# Patient Record
Sex: Female | Born: 1935 | Race: Black or African American | Hispanic: No | Smoking: Former smoker
Health system: Southern US, Community
[De-identification: ages and names within clinical notes are randomized; demographics above are authoritative.]

## PROBLEM LIST (undated history)

## (undated) DIAGNOSIS — S82843A Displaced bimalleolar fracture of unspecified lower leg, initial encounter for closed fracture: Secondary | ICD-10-CM

## (undated) DIAGNOSIS — I071 Rheumatic tricuspid insufficiency: Secondary | ICD-10-CM

## (undated) DIAGNOSIS — M25579 Pain in unspecified ankle and joints of unspecified foot: Secondary | ICD-10-CM

## (undated) DIAGNOSIS — E782 Mixed hyperlipidemia: Secondary | ICD-10-CM

## (undated) DIAGNOSIS — R0602 Shortness of breath: Secondary | ICD-10-CM

## (undated) DIAGNOSIS — K219 Gastro-esophageal reflux disease without esophagitis: Secondary | ICD-10-CM

## (undated) DIAGNOSIS — R600 Localized edema: Secondary | ICD-10-CM

## (undated) DIAGNOSIS — I2510 Atherosclerotic heart disease of native coronary artery without angina pectoris: Secondary | ICD-10-CM

## (undated) DIAGNOSIS — S82409A Unspecified fracture of shaft of unspecified fibula, initial encounter for closed fracture: Secondary | ICD-10-CM

## (undated) DIAGNOSIS — S91009A Unspecified open wound, unspecified ankle, initial encounter: Secondary | ICD-10-CM

## (undated) DIAGNOSIS — S82873A Displaced pilon fracture of unspecified tibia, initial encounter for closed fracture: Secondary | ICD-10-CM

## (undated) DIAGNOSIS — D649 Anemia, unspecified: Secondary | ICD-10-CM

## (undated) DIAGNOSIS — S72009A Fracture of unspecified part of neck of unspecified femur, initial encounter for closed fracture: Secondary | ICD-10-CM

## (undated) DIAGNOSIS — I5022 Chronic systolic (congestive) heart failure: Secondary | ICD-10-CM

## (undated) DIAGNOSIS — M79605 Pain in left leg: Secondary | ICD-10-CM

## (undated) DIAGNOSIS — N183 Chronic kidney disease, stage 3 (moderate): Secondary | ICD-10-CM

## (undated) DIAGNOSIS — S82209A Unspecified fracture of shaft of unspecified tibia, initial encounter for closed fracture: Secondary | ICD-10-CM

## (undated) DIAGNOSIS — R7989 Other specified abnormal findings of blood chemistry: Secondary | ICD-10-CM

## (undated) DIAGNOSIS — I340 Nonrheumatic mitral (valve) insufficiency: Secondary | ICD-10-CM

## (undated) DIAGNOSIS — S72143A Displaced intertrochanteric fracture of unspecified femur, initial encounter for closed fracture: Secondary | ICD-10-CM

## (undated) DIAGNOSIS — I10 Essential (primary) hypertension: Secondary | ICD-10-CM

## (undated) DIAGNOSIS — F329 Major depressive disorder, single episode, unspecified: Secondary | ICD-10-CM

## (undated) HISTORY — PX: CORONARY ARTERY BYPASS GRAFT: SHX141

## (undated) HISTORY — DX: Unspecified open wound, unspecified ankle, initial encounter: S91.009A

## (undated) HISTORY — DX: Anemia, unspecified: D64.9

## (undated) HISTORY — DX: Unspecified fracture of shaft of unspecified fibula, initial encounter for closed fracture: S82.409A

## (undated) HISTORY — DX: Mixed hyperlipidemia: E78.2

## (undated) HISTORY — DX: Chronic kidney disease, stage 3 (moderate): N18.3

## (undated) HISTORY — DX: Displaced pilon fracture of unspecified tibia, initial encounter for closed fracture: S82.873A

## (undated) HISTORY — DX: Chronic systolic (congestive) heart failure: I50.22

## (undated) HISTORY — DX: Pain in unspecified ankle and joints of unspecified foot: M25.579

## (undated) HISTORY — DX: Atherosclerotic heart disease of native coronary artery without angina pectoris: I25.10

## (undated) HISTORY — DX: Shortness of breath: R06.02

## (undated) HISTORY — DX: Major depressive disorder, single episode, unspecified: F32.9

## (undated) HISTORY — DX: Displaced intertrochanteric fracture of unspecified femur, initial encounter for closed fracture: S72.143A

## (undated) HISTORY — DX: Nonrheumatic mitral (valve) insufficiency: I34.0

## (undated) HISTORY — DX: Gastro-esophageal reflux disease without esophagitis: K21.9

## (undated) HISTORY — DX: Pain in left leg: M79.605

## (undated) HISTORY — DX: Unspecified fracture of shaft of unspecified tibia, initial encounter for closed fracture: S82.209A

## (undated) HISTORY — DX: Displaced bimalleolar fracture of unspecified lower leg, initial encounter for closed fracture: S82.843A

## (undated) HISTORY — DX: Localized edema: R60.0

## (undated) HISTORY — DX: Other specified abnormal findings of blood chemistry: R79.89

## (undated) HISTORY — PX: CARDIAC SURGERY: SHX584

## (undated) HISTORY — DX: Fracture of unspecified part of neck of unspecified femur, initial encounter for closed fracture: S72.009A

## (undated) HISTORY — DX: Rheumatic tricuspid insufficiency: I07.1

---

## 2003-05-10 ENCOUNTER — Other Ambulatory Visit: Payer: Self-pay

## 2003-05-11 ENCOUNTER — Other Ambulatory Visit: Payer: Self-pay

## 2003-10-11 ENCOUNTER — Other Ambulatory Visit: Payer: Self-pay

## 2004-02-14 ENCOUNTER — Emergency Department: Payer: Self-pay | Admitting: Internal Medicine

## 2004-02-14 ENCOUNTER — Other Ambulatory Visit: Payer: Self-pay

## 2004-03-14 ENCOUNTER — Other Ambulatory Visit: Payer: Self-pay

## 2004-03-14 ENCOUNTER — Inpatient Hospital Stay: Payer: Self-pay | Admitting: Internal Medicine

## 2004-03-17 ENCOUNTER — Ambulatory Visit: Payer: Self-pay | Admitting: Internal Medicine

## 2004-05-12 ENCOUNTER — Ambulatory Visit: Payer: Self-pay | Admitting: Unknown Physician Specialty

## 2004-07-11 ENCOUNTER — Emergency Department: Payer: Self-pay | Admitting: Emergency Medicine

## 2005-07-26 ENCOUNTER — Ambulatory Visit: Payer: Self-pay | Admitting: Internal Medicine

## 2005-10-15 ENCOUNTER — Other Ambulatory Visit: Payer: Self-pay

## 2005-10-15 ENCOUNTER — Emergency Department: Payer: Self-pay | Admitting: Emergency Medicine

## 2006-03-22 ENCOUNTER — Ambulatory Visit: Payer: Self-pay | Admitting: Unknown Physician Specialty

## 2006-08-11 ENCOUNTER — Ambulatory Visit: Payer: Self-pay | Admitting: Internal Medicine

## 2006-08-29 ENCOUNTER — Ambulatory Visit: Payer: Self-pay | Admitting: Unknown Physician Specialty

## 2006-09-27 ENCOUNTER — Ambulatory Visit: Payer: Self-pay | Admitting: Vascular Surgery

## 2006-10-11 ENCOUNTER — Ambulatory Visit: Payer: Self-pay | Admitting: Vascular Surgery

## 2007-05-08 ENCOUNTER — Emergency Department: Payer: Self-pay | Admitting: Emergency Medicine

## 2007-08-29 ENCOUNTER — Ambulatory Visit: Payer: Self-pay | Admitting: Internal Medicine

## 2007-10-02 ENCOUNTER — Ambulatory Visit: Payer: Self-pay | Admitting: Internal Medicine

## 2007-10-09 ENCOUNTER — Ambulatory Visit: Payer: Self-pay | Admitting: Internal Medicine

## 2007-11-01 ENCOUNTER — Ambulatory Visit: Payer: Self-pay | Admitting: Obstetrics and Gynecology

## 2008-01-10 ENCOUNTER — Ambulatory Visit: Payer: Self-pay | Admitting: Urology

## 2008-07-09 ENCOUNTER — Ambulatory Visit: Payer: Self-pay | Admitting: Urology

## 2008-11-12 ENCOUNTER — Ambulatory Visit: Payer: Self-pay | Admitting: Ophthalmology

## 2008-11-26 ENCOUNTER — Ambulatory Visit: Payer: Self-pay | Admitting: Ophthalmology

## 2009-01-29 ENCOUNTER — Ambulatory Visit: Payer: Self-pay | Admitting: Internal Medicine

## 2009-02-11 ENCOUNTER — Ambulatory Visit: Payer: Self-pay | Admitting: Ophthalmology

## 2009-07-14 ENCOUNTER — Ambulatory Visit: Payer: Self-pay | Admitting: Internal Medicine

## 2009-10-12 ENCOUNTER — Emergency Department: Payer: Self-pay | Admitting: Emergency Medicine

## 2009-12-09 ENCOUNTER — Ambulatory Visit: Payer: Self-pay | Admitting: Internal Medicine

## 2010-01-04 ENCOUNTER — Emergency Department: Payer: Self-pay | Admitting: Emergency Medicine

## 2010-08-12 ENCOUNTER — Inpatient Hospital Stay: Payer: Self-pay | Admitting: Internal Medicine

## 2010-09-19 ENCOUNTER — Emergency Department: Payer: Self-pay | Admitting: Unknown Physician Specialty

## 2011-03-15 ENCOUNTER — Ambulatory Visit: Payer: Self-pay

## 2012-12-01 ENCOUNTER — Emergency Department: Payer: Self-pay | Admitting: Emergency Medicine

## 2012-12-06 DIAGNOSIS — M25579 Pain in unspecified ankle and joints of unspecified foot: Secondary | ICD-10-CM

## 2012-12-06 DIAGNOSIS — S82843A Displaced bimalleolar fracture of unspecified lower leg, initial encounter for closed fracture: Secondary | ICD-10-CM

## 2012-12-06 HISTORY — DX: Pain in unspecified ankle and joints of unspecified foot: M25.579

## 2012-12-06 HISTORY — DX: Displaced bimalleolar fracture of unspecified lower leg, initial encounter for closed fracture: S82.843A

## 2012-12-11 ENCOUNTER — Ambulatory Visit: Payer: Self-pay | Admitting: Orthopaedic Surgery

## 2012-12-13 DIAGNOSIS — S82873A Displaced pilon fracture of unspecified tibia, initial encounter for closed fracture: Secondary | ICD-10-CM

## 2012-12-13 HISTORY — DX: Displaced pilon fracture of unspecified tibia, initial encounter for closed fracture: S82.873A

## 2012-12-19 DIAGNOSIS — I2510 Atherosclerotic heart disease of native coronary artery without angina pectoris: Secondary | ICD-10-CM

## 2012-12-19 DIAGNOSIS — K219 Gastro-esophageal reflux disease without esophagitis: Secondary | ICD-10-CM

## 2012-12-19 DIAGNOSIS — F329 Major depressive disorder, single episode, unspecified: Secondary | ICD-10-CM | POA: Insufficient documentation

## 2012-12-19 DIAGNOSIS — F32A Depression, unspecified: Secondary | ICD-10-CM

## 2012-12-19 HISTORY — DX: Atherosclerotic heart disease of native coronary artery without angina pectoris: I25.10

## 2012-12-19 HISTORY — DX: Depression, unspecified: F32.A

## 2012-12-19 HISTORY — DX: Gastro-esophageal reflux disease without esophagitis: K21.9

## 2012-12-22 DIAGNOSIS — S82409A Unspecified fracture of shaft of unspecified fibula, initial encounter for closed fracture: Secondary | ICD-10-CM

## 2012-12-22 DIAGNOSIS — S82209A Unspecified fracture of shaft of unspecified tibia, initial encounter for closed fracture: Secondary | ICD-10-CM | POA: Insufficient documentation

## 2012-12-22 HISTORY — DX: Unspecified fracture of shaft of unspecified tibia, initial encounter for closed fracture: S82.209A

## 2013-02-28 DIAGNOSIS — S91009A Unspecified open wound, unspecified ankle, initial encounter: Secondary | ICD-10-CM

## 2013-02-28 HISTORY — DX: Unspecified open wound, unspecified ankle, initial encounter: S91.009A

## 2013-08-04 ENCOUNTER — Emergency Department: Payer: Self-pay | Admitting: Emergency Medicine

## 2013-10-03 DIAGNOSIS — R0602 Shortness of breath: Secondary | ICD-10-CM | POA: Insufficient documentation

## 2013-10-03 HISTORY — DX: Shortness of breath: R06.02

## 2014-05-20 DIAGNOSIS — I5022 Chronic systolic (congestive) heart failure: Secondary | ICD-10-CM | POA: Insufficient documentation

## 2014-05-20 HISTORY — DX: Chronic systolic (congestive) heart failure: I50.22

## 2014-09-09 IMAGING — CR DG LUMBAR SPINE 2-3V
1 series · 3 of 3 positions shown · non-contrast
Comparison: none

REASON FOR EXAM: fall, low back pain
COMMENTS:

PROCEDURE:     DXR - DXR LUMBAR SPINE AP AND LATERAL  - December 01, 2012 [DATE]
RESULT:

[Series 2: t lumbar spine ap · 0.14mm/px · 3 of 3 slices shown]
[im 1/3]
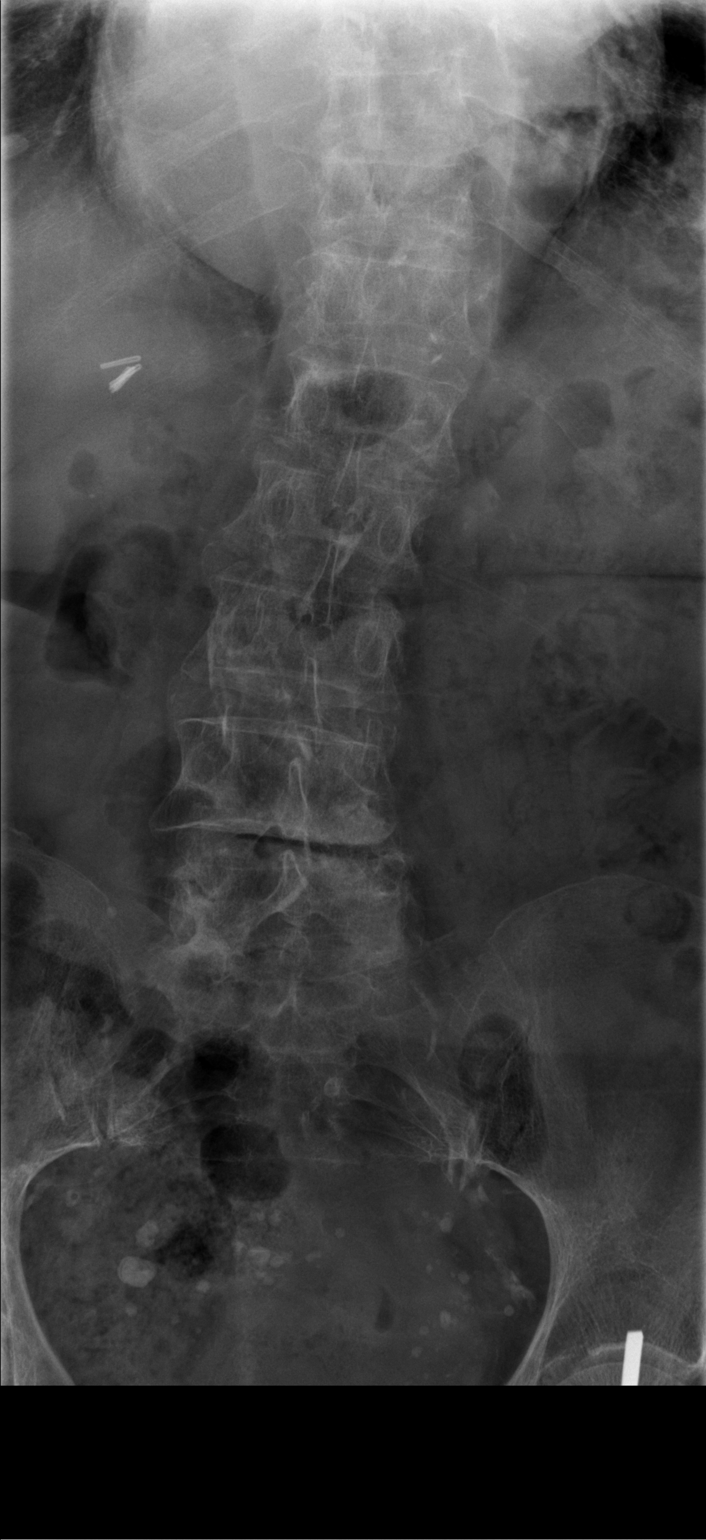
[im 2/3]
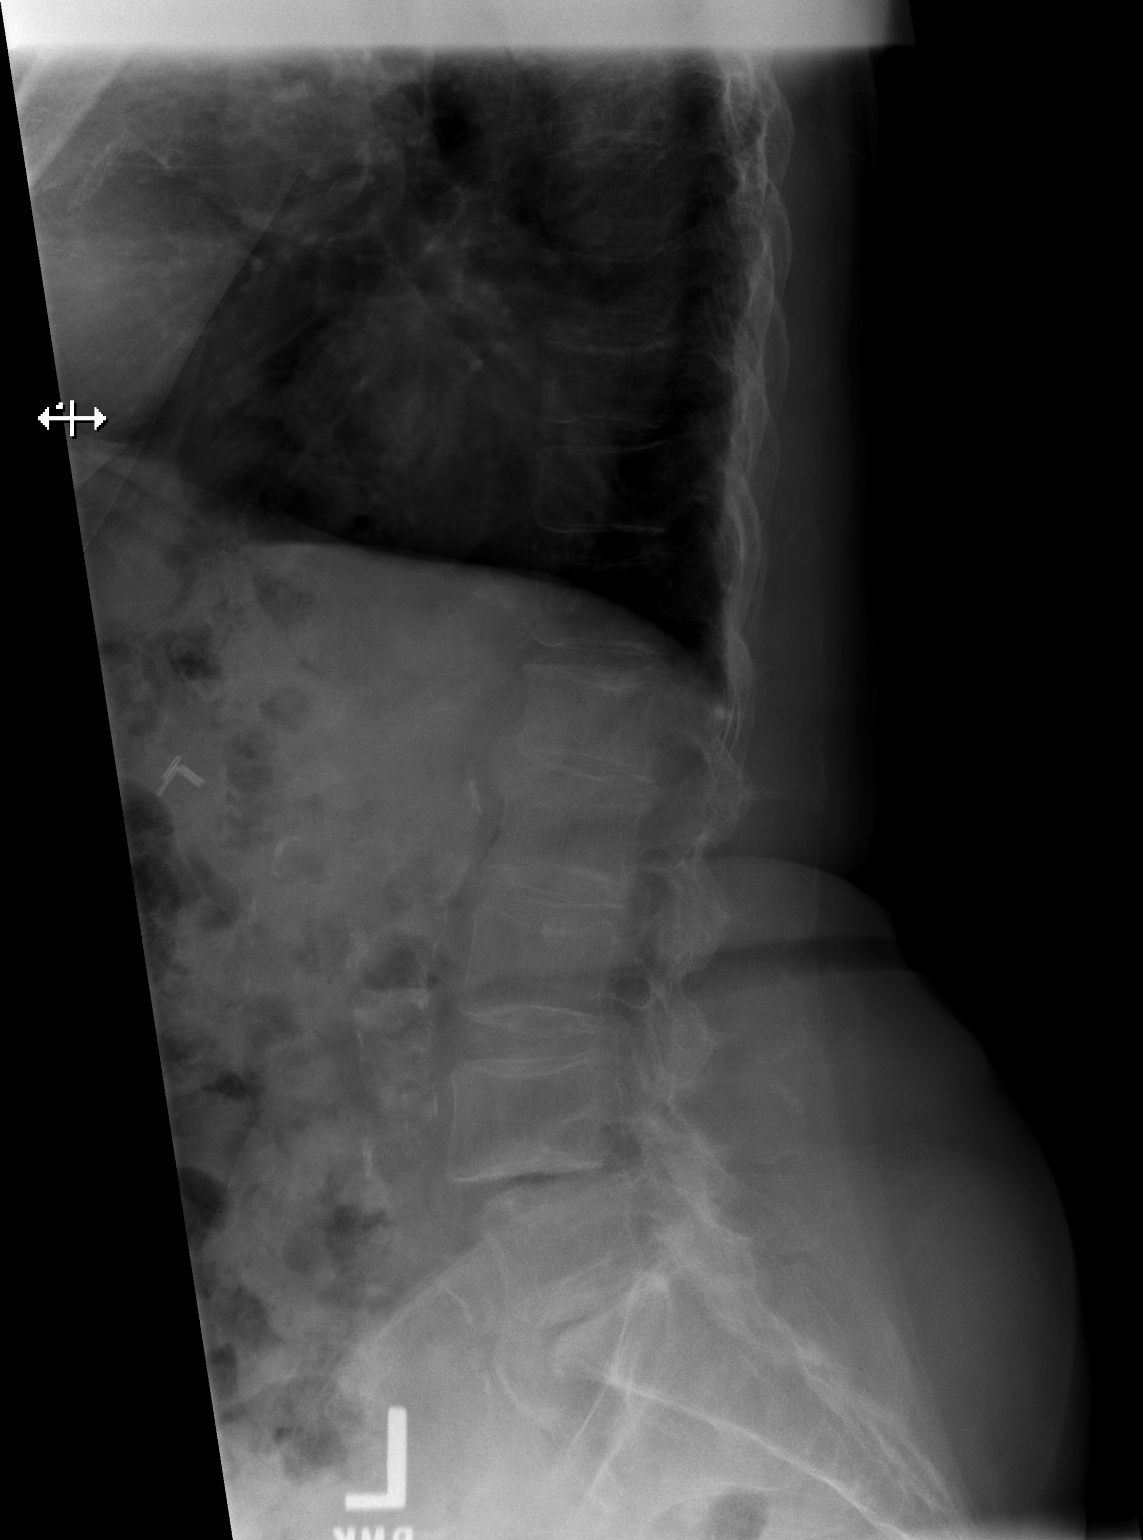
[im 3/3]
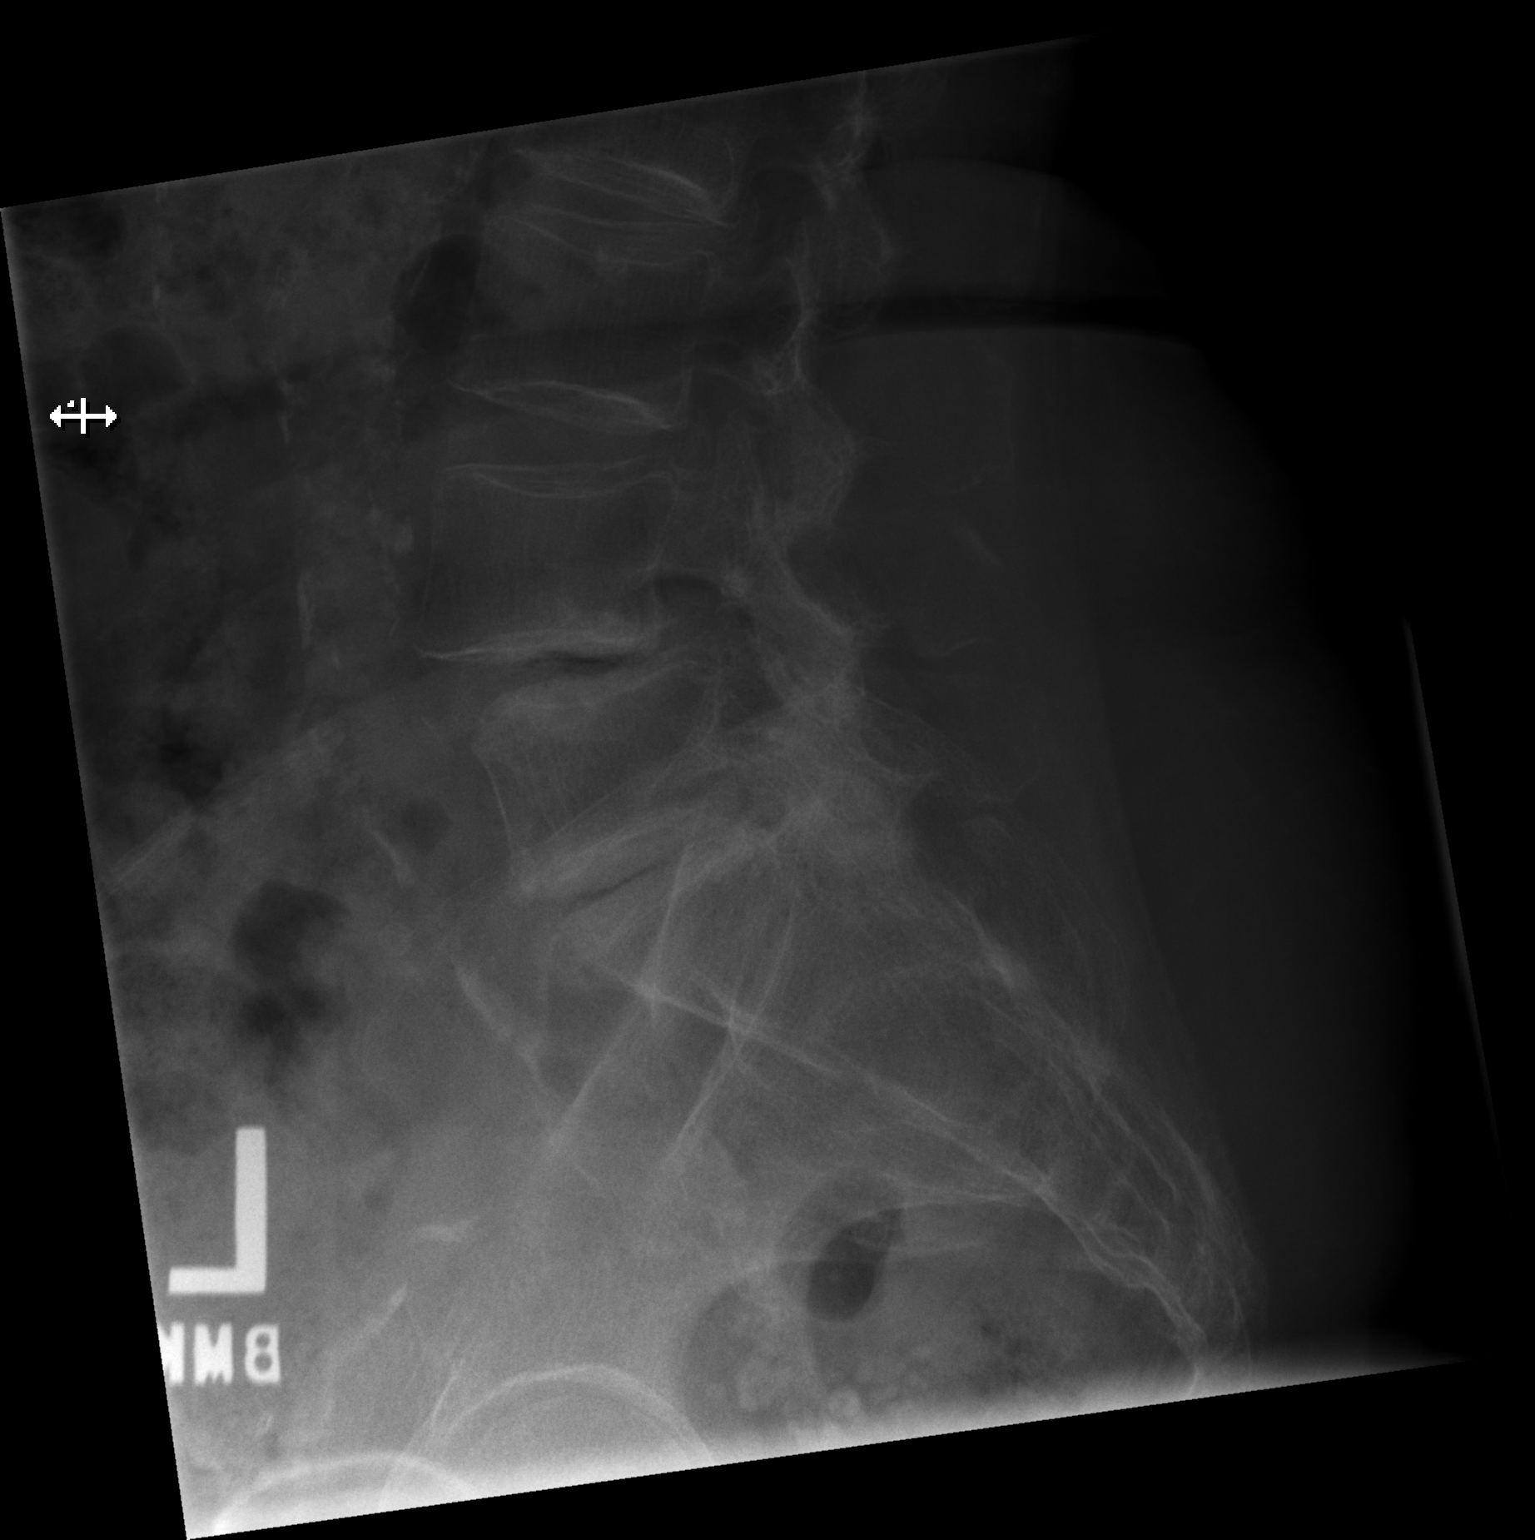

[3 of 3 positions shown; findings below may reference images not displayed]

FINDINGS: The bones are osteopenic. There is no evidence of acute fracture
or dislocation. Grade 1 anterolisthesis is appreciated at the L4-5 level.
Degenerative disc disease changes are appreciated at T12-L1, L1-2, L2-3,
L4-5 and L5-S1.
IMPRESSION: 1.     Osteopenia without evidence of acute osseous abnormalities.
2.     Multilevel degenerative disc disease changes.
3.     Grade I anterolisthesis, L4-5.

## 2014-09-09 IMAGING — CR DG HIP COMPLETE 2+V*L*
1 series · 2 of 2 positions shown · non-contrast
Comparison: none

REASON FOR EXAM: left hip pain, fall
COMMENTS:

PROCEDURE:     DXR - DXR HIP LEFT COMPLETE  - December 01, 2012 [DATE]
RESULT:     There is no evidence of fracture, dislocation, or malalignment.

[Series 2: t hip ap left · 0.14mm/px · 2 of 2 slices shown]
[im 1/2]
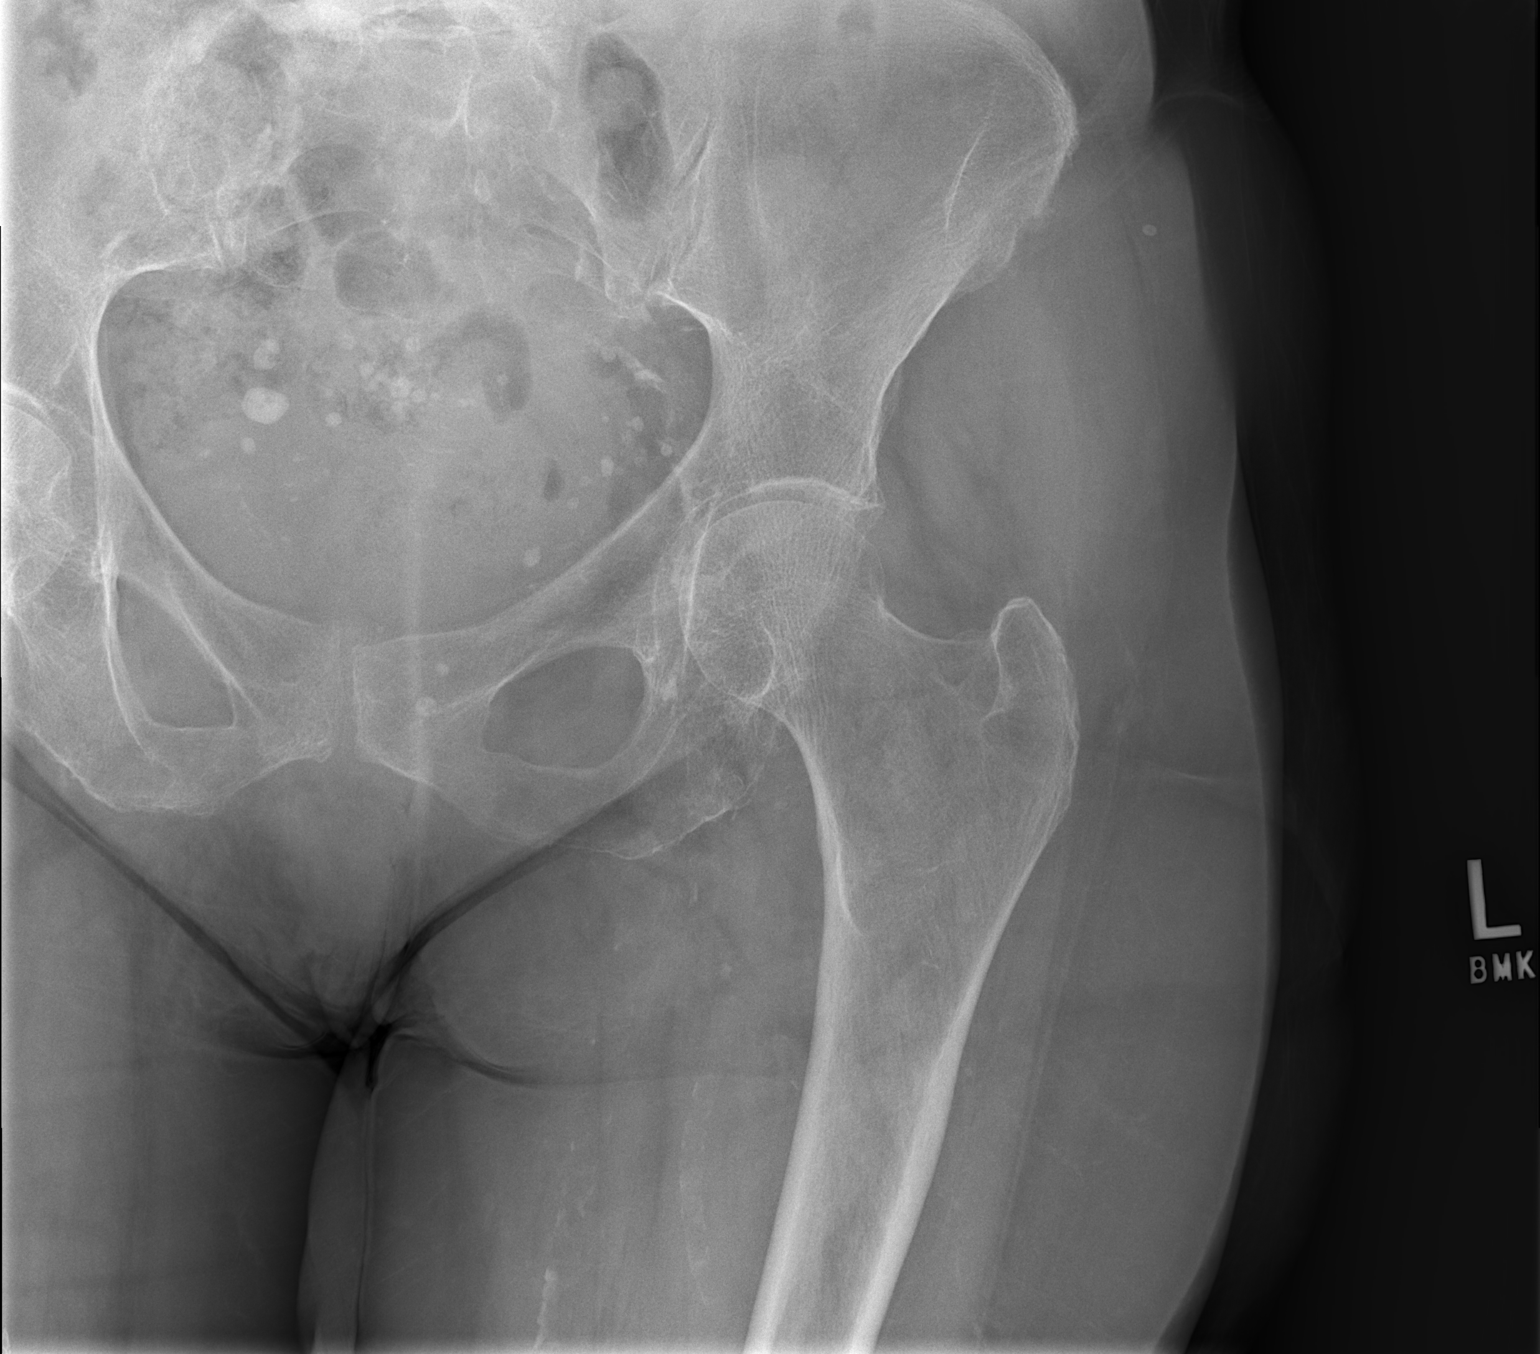
[im 2/2]
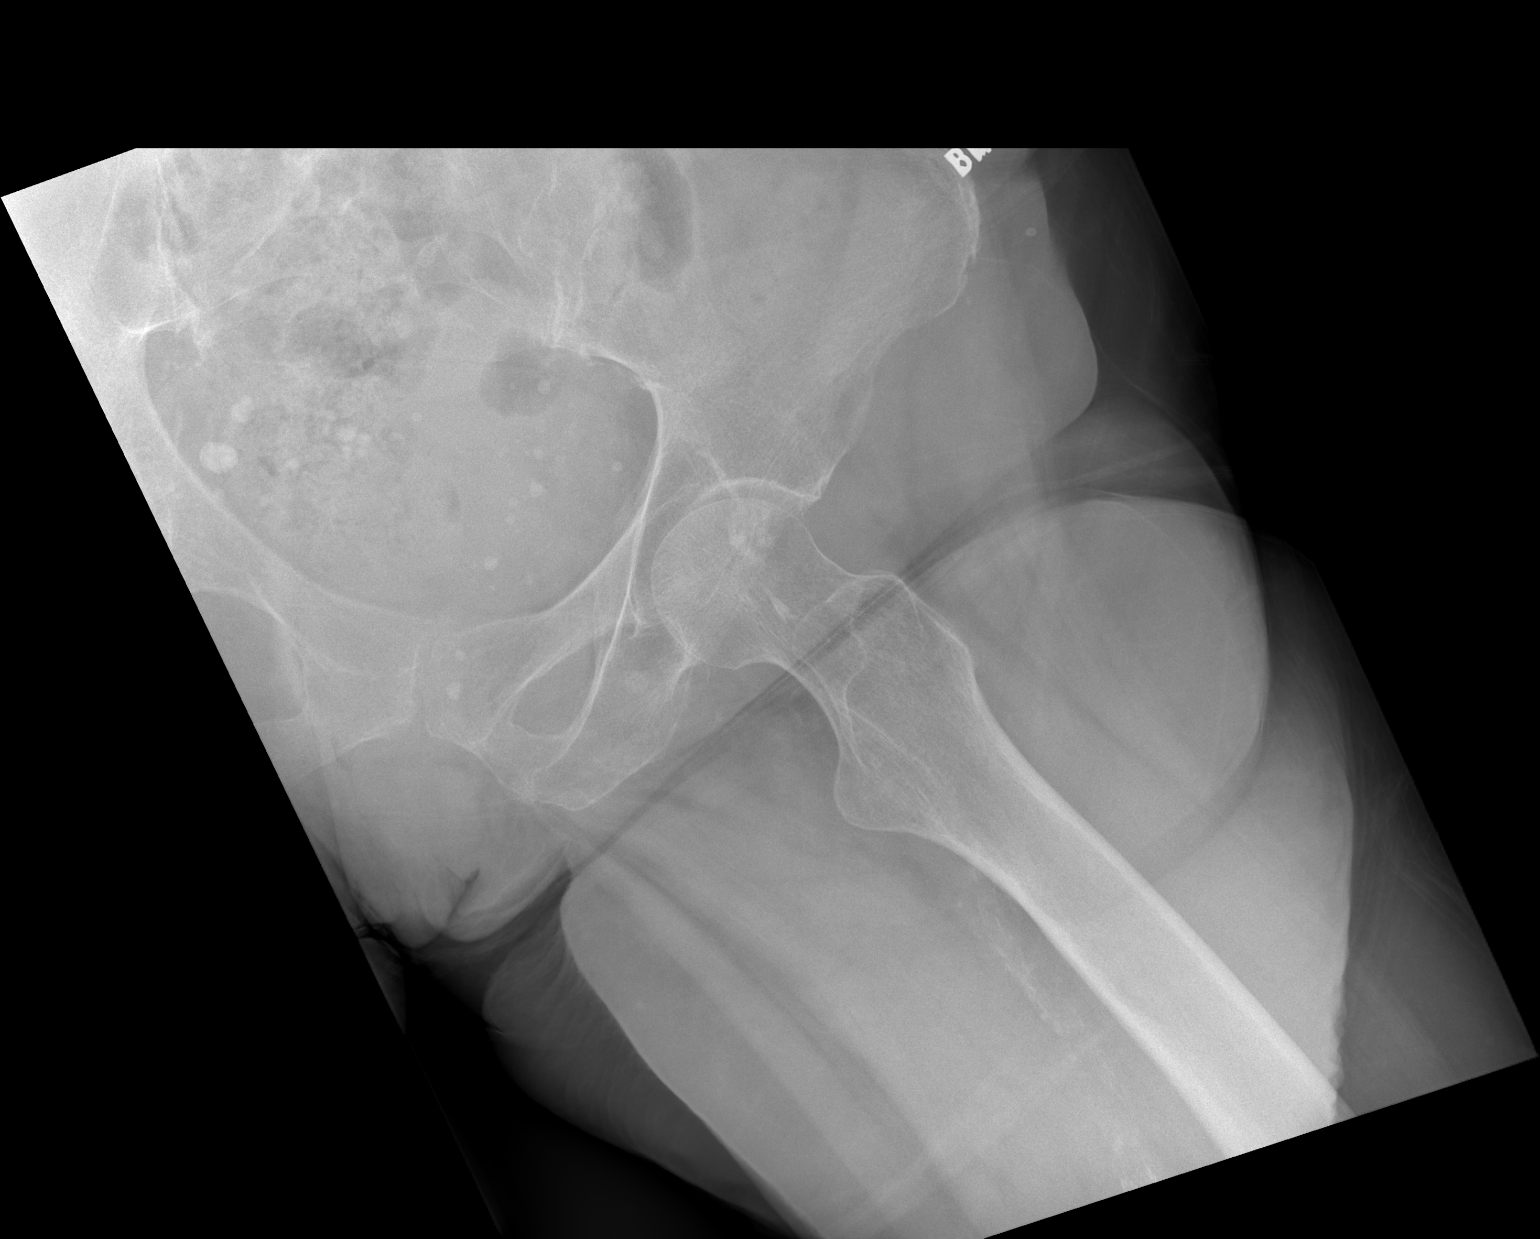

[2 of 2 positions shown; findings below may reference images not displayed]

IMPRESSION: 1. No evidence of acute abnormalities.
2. If there are persistent complaints of pain or persistent clinical
concern, a repeat evaluation in 7-10 days is recommended if clinically
warranted.

## 2014-09-13 DIAGNOSIS — I10 Essential (primary) hypertension: Secondary | ICD-10-CM | POA: Insufficient documentation

## 2015-01-20 DIAGNOSIS — N183 Chronic kidney disease, stage 3 unspecified: Secondary | ICD-10-CM

## 2015-01-20 DIAGNOSIS — M79605 Pain in left leg: Secondary | ICD-10-CM | POA: Insufficient documentation

## 2015-01-20 DIAGNOSIS — I340 Nonrheumatic mitral (valve) insufficiency: Secondary | ICD-10-CM | POA: Insufficient documentation

## 2015-01-20 DIAGNOSIS — I071 Rheumatic tricuspid insufficiency: Secondary | ICD-10-CM

## 2015-01-20 DIAGNOSIS — R600 Localized edema: Secondary | ICD-10-CM

## 2015-01-20 HISTORY — DX: Chronic kidney disease, stage 3 unspecified: N18.30

## 2015-01-20 HISTORY — DX: Rheumatic tricuspid insufficiency: I07.1

## 2015-01-20 HISTORY — DX: Localized edema: R60.0

## 2015-01-20 HISTORY — DX: Nonrheumatic mitral (valve) insufficiency: I34.0

## 2015-01-20 HISTORY — DX: Pain in left leg: M79.605

## 2015-03-30 ENCOUNTER — Inpatient Hospital Stay
Admission: EM | Admit: 2015-03-30 | Discharge: 2015-04-03 | DRG: 481 | Disposition: A | Discharge status: Skilled Nursing Facility | Payer: Medicare Other | Attending: Internal Medicine | Admitting: Internal Medicine

## 2015-03-30 ENCOUNTER — Emergency Department: Payer: Medicare Other

## 2015-03-30 ENCOUNTER — Encounter: Payer: Self-pay | Admitting: Emergency Medicine

## 2015-03-30 DIAGNOSIS — I255 Ischemic cardiomyopathy: Secondary | ICD-10-CM | POA: Diagnosis present

## 2015-03-30 DIAGNOSIS — I129 Hypertensive chronic kidney disease with stage 1 through stage 4 chronic kidney disease, or unspecified chronic kidney disease: Secondary | ICD-10-CM | POA: Diagnosis present

## 2015-03-30 DIAGNOSIS — Z79899 Other long term (current) drug therapy: Secondary | ICD-10-CM | POA: Diagnosis not present

## 2015-03-30 DIAGNOSIS — I2510 Atherosclerotic heart disease of native coronary artery without angina pectoris: Secondary | ICD-10-CM | POA: Diagnosis present

## 2015-03-30 DIAGNOSIS — Z7982 Long term (current) use of aspirin: Secondary | ICD-10-CM

## 2015-03-30 DIAGNOSIS — F0390 Unspecified dementia without behavioral disturbance: Secondary | ICD-10-CM | POA: Diagnosis present

## 2015-03-30 DIAGNOSIS — R339 Retention of urine, unspecified: Secondary | ICD-10-CM | POA: Diagnosis not present

## 2015-03-30 DIAGNOSIS — Z951 Presence of aortocoronary bypass graft: Secondary | ICD-10-CM

## 2015-03-30 DIAGNOSIS — N179 Acute kidney failure, unspecified: Secondary | ICD-10-CM | POA: Diagnosis not present

## 2015-03-30 DIAGNOSIS — E785 Hyperlipidemia, unspecified: Secondary | ICD-10-CM | POA: Diagnosis present

## 2015-03-30 DIAGNOSIS — S72009A Fracture of unspecified part of neck of unspecified femur, initial encounter for closed fracture: Secondary | ICD-10-CM | POA: Diagnosis present

## 2015-03-30 DIAGNOSIS — N183 Chronic kidney disease, stage 3 (moderate): Secondary | ICD-10-CM | POA: Diagnosis present

## 2015-03-30 DIAGNOSIS — I081 Rheumatic disorders of both mitral and tricuspid valves: Secondary | ICD-10-CM | POA: Diagnosis present

## 2015-03-30 DIAGNOSIS — Z87891 Personal history of nicotine dependence: Secondary | ICD-10-CM

## 2015-03-30 DIAGNOSIS — Z419 Encounter for procedure for purposes other than remedying health state, unspecified: Secondary | ICD-10-CM

## 2015-03-30 DIAGNOSIS — W010XXA Fall on same level from slipping, tripping and stumbling without subsequent striking against object, initial encounter: Secondary | ICD-10-CM | POA: Diagnosis present

## 2015-03-30 DIAGNOSIS — S72142A Displaced intertrochanteric fracture of left femur, initial encounter for closed fracture: Secondary | ICD-10-CM | POA: Diagnosis present

## 2015-03-30 DIAGNOSIS — R531 Weakness: Secondary | ICD-10-CM

## 2015-03-30 DIAGNOSIS — S72002A Fracture of unspecified part of neck of left femur, initial encounter for closed fracture: Secondary | ICD-10-CM

## 2015-03-30 DIAGNOSIS — Z66 Do not resuscitate: Secondary | ICD-10-CM | POA: Diagnosis present

## 2015-03-30 HISTORY — DX: Fracture of unspecified part of neck of unspecified femur, initial encounter for closed fracture: S72.009A

## 2015-03-30 HISTORY — DX: Atherosclerotic heart disease of native coronary artery without angina pectoris: I25.10

## 2015-03-30 HISTORY — DX: Essential (primary) hypertension: I10

## 2015-03-30 LAB — BASIC METABOLIC PANEL
Anion gap: 3 — ABNORMAL LOW (ref 5–15)
BUN: 20 mg/dL (ref 6–20)
CALCIUM: 8.7 mg/dL — AB (ref 8.9–10.3)
CO2: 27 mmol/L (ref 22–32)
CREATININE: 0.91 mg/dL (ref 0.44–1.00)
Chloride: 110 mmol/L (ref 101–111)
GFR calc non Af Amer: 55 mL/min — ABNORMAL LOW (ref >60)
Glucose, Bld: 116 mg/dL — ABNORMAL HIGH (ref 65–99)
Potassium: 4.4 mmol/L (ref 3.5–5.1)
Sodium: 140 mmol/L (ref 135–145)

## 2015-03-30 LAB — URINALYSIS COMPLETE WITH MICROSCOPIC (ARMC ONLY)
Bilirubin Urine: NEGATIVE
GLUCOSE, UA: NEGATIVE mg/dL
Hgb urine dipstick: NEGATIVE
Ketones, ur: NEGATIVE mg/dL
LEUKOCYTES UA: NEGATIVE
Nitrite: NEGATIVE
PROTEIN: 100 mg/dL — AB
SQUAMOUS EPITHELIAL / LPF: NONE SEEN
Specific Gravity, Urine: 1.011 (ref 1.005–1.030)
pH: 6 (ref 5.0–8.0)

## 2015-03-30 LAB — CBC WITH DIFFERENTIAL/PLATELET
BASOS PCT: 0 %
Basophils Absolute: 0 10*3/uL (ref 0–0.1)
EOS ABS: 0 10*3/uL (ref 0–0.7)
Eosinophils Relative: 0 %
HEMATOCRIT: 31.7 % — AB (ref 35.0–47.0)
Hemoglobin: 10.1 g/dL — ABNORMAL LOW (ref 12.0–16.0)
Lymphocytes Relative: 6 %
Lymphs Abs: 0.6 10*3/uL — ABNORMAL LOW (ref 1.0–3.6)
MCH: 28.6 pg (ref 26.0–34.0)
MCHC: 32 g/dL (ref 32.0–36.0)
MCV: 89.3 fL (ref 80.0–100.0)
MONO ABS: 0.4 10*3/uL (ref 0.2–0.9)
MONOS PCT: 4 %
NEUTROS ABS: 10.2 10*3/uL — AB (ref 1.4–6.5)
Neutrophils Relative %: 90 %
Platelets: 189 10*3/uL (ref 150–440)
RBC: 3.55 MIL/uL — ABNORMAL LOW (ref 3.80–5.20)
RDW: 15.9 % — AB (ref 11.5–14.5)
WBC: 11.3 10*3/uL — ABNORMAL HIGH (ref 3.6–11.0)

## 2015-03-30 LAB — SURGICAL PCR SCREEN
MRSA, PCR: NEGATIVE
STAPHYLOCOCCUS AUREUS: NEGATIVE

## 2015-03-30 LAB — TROPONIN I: Troponin I: <0.03 ng/mL (ref <0.031)

## 2015-03-30 LAB — PROTIME-INR
INR: 1.04
PROTHROMBIN TIME: 13.8 s (ref 11.4–15.0)

## 2015-03-30 MED ORDER — SODIUM CHLORIDE 0.9 % IV SOLN
INTRAVENOUS | Status: DC
  Administered 2015-03-30 – 2015-03-31 (×2): via INTRAVENOUS

## 2015-03-30 MED ORDER — MORPHINE SULFATE (PF) 2 MG/ML IV SOLN
2.0000 mg | Freq: Once | INTRAVENOUS | Status: AC
  Administered 2015-03-30: 2 mg via INTRAVENOUS
  Filled 2015-03-30: qty 1

## 2015-03-30 MED ORDER — ONDANSETRON HCL 4 MG PO TABS: 4.0000 mg | ORAL_TABLET | Freq: Four times a day (QID) | ORAL | Status: DC | PRN

## 2015-03-30 MED ORDER — ONDANSETRON HCL 4 MG/2ML IJ SOLN: 4.0000 mg | Freq: Four times a day (QID) | INTRAMUSCULAR | Status: DC | PRN

## 2015-03-30 MED ORDER — ISOSORBIDE MONONITRATE ER 30 MG PO TB24
30.0000 mg | ORAL_TABLET | Freq: Every day | ORAL | Status: DC
  Administered 2015-03-31 – 2015-04-03 (×4): 30 mg via ORAL
  Filled 2015-03-30 (×4): qty 1

## 2015-03-30 MED ORDER — ACETAMINOPHEN 325 MG PO TABS: 650.0000 mg | ORAL_TABLET | Freq: Four times a day (QID) | ORAL | Status: DC | PRN

## 2015-03-30 MED ORDER — ASPIRIN EC 81 MG PO TBEC: 81.0000 mg | DELAYED_RELEASE_TABLET | Freq: Every day | ORAL | Status: DC

## 2015-03-30 MED ORDER — ACETAMINOPHEN 650 MG RE SUPP: 650.0000 mg | Freq: Four times a day (QID) | RECTAL | Status: DC | PRN

## 2015-03-30 MED ORDER — ATORVASTATIN CALCIUM 20 MG PO TABS
20.0000 mg | ORAL_TABLET | Freq: Every day | ORAL | Status: DC
Start: 2015-03-30 — End: 2015-03-31

## 2015-03-30 MED ORDER — FUROSEMIDE 40 MG PO TABS: 40.0000 mg | ORAL_TABLET | Freq: Once | ORAL | Status: DC

## 2015-03-30 MED ORDER — BISACODYL 5 MG PO TBEC: 5.0000 mg | DELAYED_RELEASE_TABLET | Freq: Every day | ORAL | Status: DC | PRN

## 2015-03-30 MED ORDER — SENNOSIDES-DOCUSATE SODIUM 8.6-50 MG PO TABS: 1.0000 | ORAL_TABLET | Freq: Every evening | ORAL | Status: DC | PRN

## 2015-03-30 MED ORDER — CEFAZOLIN SODIUM-DEXTROSE 2-3 GM-% IV SOLR
2.0000 g | INTRAVENOUS | Status: AC
  Administered 2015-03-31: 2 g via INTRAVENOUS
  Filled 2015-03-30 (×2): qty 50

## 2015-03-30 MED ORDER — LISINOPRIL 10 MG PO TABS
10.0000 mg | ORAL_TABLET | Freq: Every day | ORAL | Status: DC
  Administered 2015-04-01 – 2015-04-03 (×3): 10 mg via ORAL
  Filled 2015-03-30 (×3): qty 1

## 2015-03-30 MED ORDER — ONDANSETRON HCL 4 MG/2ML IJ SOLN
4.0000 mg | Freq: Once | INTRAMUSCULAR | Status: AC
  Administered 2015-03-30: 4 mg via INTRAVENOUS
  Filled 2015-03-30: qty 2

## 2015-03-30 MED ORDER — CARVEDILOL 3.125 MG PO TABS
3.1250 mg | ORAL_TABLET | Freq: Two times a day (BID) | ORAL | Status: DC
  Administered 2015-03-30 – 2015-03-31 (×2): 3.125 mg via ORAL
  Filled 2015-03-30 (×2): qty 1

## 2015-03-30 MED ORDER — ALUM & MAG HYDROXIDE-SIMETH 200-200-20 MG/5ML PO SUSP: 30.0000 mL | Freq: Four times a day (QID) | ORAL | Status: DC | PRN

## 2015-03-30 MED ORDER — HYDROCODONE-ACETAMINOPHEN 5-325 MG PO TABS
1.0000 | ORAL_TABLET | ORAL | Status: DC | PRN
  Administered 2015-03-30: 2 via ORAL
  Administered 2015-03-30: 1 via ORAL
  Administered 2015-03-31: 2 via ORAL
  Filled 2015-03-30 (×2): qty 2
  Filled 2015-03-30: qty 1

## 2015-03-30 NOTE — H&P (Signed)
[facility] - [place] at [facility]   PATIENT NAME: Vickie Yang    MR#:  [id number]  DATE OF BIRTH:  Sep 11, 1924  DATE OF ADMISSION:  03/30/2015  PRIMARY CARE PHYSICIAN: Dr Marcello Fennel   REQUESTING/REFERRING PHYSICIAN: Dr Derrill Kay  CHIEF COMPLAINT:  fall  HISTORY OF PRESENT ILLNESS:  Vickie Yang  is a 81 y.o. female with a known history of coronary artery disease/CABG, ischemic cardiomyopathy EF 35%, severe mitral and tricuspid regurgitation and essential hypertension who presents with an unwitnessed fall. Patient states she lost her balance and fell. She was at her grandson who is able to pick her up, but patient was unable to walk and she was brought here to the emergency room for further evaluation. In the emergency room. X-ray was performed which does show a left trochaneric fracture. She denies chest pain, dyspnea, exertion, per lower extremity edema has much improved. She was recently seen by her cardiologist at the beginning of November in which she cardiac wise has been doing fantastic. Actually had a chance to speak with her cardiologist this morning about the patient and he also feels the patient, as a past cardiac wise as she could be for surgery.  PAST MEDICAL HISTORY:   Past Medical History  Diagnosis Date  . Coronary artery disease   . Hypertension   . Cardiomyopathy, ischemic in nature Severe mitral and tricuspid regurgitation Hyperlipidemia Left leg pain Bilateral leg edema Chronic kidney disease stage III  PAST SURGICAL HISTORY:   Past Surgical History  Procedure Laterality Date  . Cardiac surgery     CABG, three-vessel Bimalleolar fracture  SOCIAL HISTORY:   Social History  Substance Use Topics  . Smoking status: Former Games developer  . Smokeless tobacco: Not on file  . Alcohol Use: No    FAMILY HISTORY:  Unknown to patient  DRUG ALLERGIES:  No Known Allergies   REVIEW OF SYSTEMS:  CONSTITUTIONAL: No fever, fatigue or weakness.   EYES: No blurred or double vision.  EARS, NOSE, AND THROAT: No tinnitus or ear pain.  RESPIRATORY: No cough, shortness of breath, wheezing or hemoptysis.  CARDIOVASCULAR: No chest pain, orthopnea, edema.  GASTROINTESTINAL: No nausea, vomiting, diarrhea or abdominal pain.  GENITOURINARY: No dysuria, hematuria.  ENDOCRINE: No polyuria, nocturia,  HEMATOLOGY: No anemia, easy bruising or bleeding SKIN: No rash or lesion. MUSCULOSKELETAL: Positive hip pain NEUROLOGIC: No tingling, numbness, weakness.  PSYCHIATRY: No anxiety or depression.   MEDICATIONS AT HOME:    Nitroglycerin sublingual when necessary she has not used this in several years Lisinopril 10 mg daily Vitamin D 50,000 units once a week Aspirin 81 mg daily Norco one tablet every 6 hours. Pain Lasix 20 mg twice a day Lipitor 20 mm a chest Indoor 39 g daily Vitamin B12 500 g grams twice a day BUSPAR 5 milligrams daily Prilosec 20 daily ZOLOFT 25 daily Norvasc 5 mg daily Coreg 3.125 BID  VITAL SIGNS:  Blood pressure 156/86, pulse 77, temperature 98.1 F (36.7 C), temperature source Oral, resp. rate 17, height  (1.575 m), weight 49.669 kg (109 lb 8 oz), SpO2 97 %.  PHYSICAL EXAMINATION:  GENERAL:  81 y.o.-year-old patient lying in the bed with no acute distress.  EYES: Pupils equal, round, reactive to light and accommodation. No scleral icterus. Extraocular muscles intact.  HEENT: Head atraumatic, normocephalic. Oropharynx and nasopharynx clear.  NECK:  Supple, no jugular venous distention. No thyroid enlargement, no tenderness.  LUNGS: Normal breath sounds bilaterally, no wheezing, rales,rhonchi or crepitation. No  use of accessory muscles of respiration.  CARDIOVASCULAR: S1, S2 normal. 4/6 SEM NO rubs, or gallops.  ABDOMEN: Soft, nontender, nondistended. Bowel sounds present. No organomegaly or mass.  EXTREMITIES: No pedal edema, cyanosis, or clubbing. =++eft hip pain shortened NEUROLOGIC: Cranial nerves II  through XII are grossly intact. No focal deficits. PSYCHIATRIC: The patient is alert and oriented x 3.  SKIN: No obvious rash, lesion, or ulcer.   LABORATORY PANEL:   CBC No results for input(s): WBC, HGB, HCT, PLT in the last 168 hours. ------------------------------------------------------------------------------------------------------------------  Chemistries  No results for input(s): NA, K, CL, CO2, GLUCOSE, BUN, CREATININE, CALCIUM, MG, AST, ALT, ALKPHOS, BILITOT in the last 168 hours.  Invalid input(s): GFRCGP ------------------------------------------------------------------------------------------------------------------  Cardiac Enzymes No results for input(s): TROPONINI in the last 168 hours. ------------------------------------------------------------------------------------------------------------------  RADIOLOGY:  Dg Chest Port 1 View  03/30/2015  CLINICAL DATA:  Weakness hypertension. EXAM: PORTABLE CHEST 1 VIEW COMPARISON:  Radiograph 09/1910 FINDINGS: Sternotomy wires overlie enlarged cardiac silhouette. Large rounded retrocardiac density likely represents hiatal hernia. Lungs are hyperinflated. There is chronic bronchitic markings. Mild basilar scarring on the LEFT. IMPRESSION: 1. No acute cardiopulmonary findings. 2. Cardiomegaly and hyperinflated lungs. 3. Large hiatal hernia. Electronically Signed   By: Genevive BiStewart  Edmunds M.D.   On: 03/30/2015 11:33   Dg Hip Unilat With Pelvis 2-3 Views Left  03/30/2015  CLINICAL DATA:  Fall with left hip pain. EXAM: DG HIP (WITH OR WITHOUT PELVIS) 2-3V LEFT COMPARISON:  12/01/2012 FINDINGS: There is diffuse decreased bone mineralization. There are mild symmetric degenerative changes of the spine. There is a displaced left trochanteric fracture with superior anterior displacement of the distal fragment an exaggerated coxa vera deformity about the fracture site. Moderate degenerative changes of the spine. Multiple pelvic phleboliths.  Atherosclerotic disease. IMPRESSION: Displaced left trochanteric fracture. Electronically Signed   By: Elberta Fortisaniel  Boyle M.D.   On: 03/30/2015 11:29    EKG:   Sinus rhythm with a heart rate of 77. There are some PVCs but no ST elevation or depression  IMPRESSION AND PLAN:    This is a very pleasant 81 year old female with a history of coronary artery disease/CABG, severe mitral and tricuspid regurgitation, ischemic cardiomyopathy with an EF of 35%, presents after mechanical fall and unfortunately has suffered a displaced left trochanteric fracture.  1. Preoperative clearance for left trochanteric fracture: Patient is moderate risk for moderate risk procedure. Patient should remain preoperatively and postoperatively. On her beta blocker. She should continue statin, Imdur, aspirin, as well. Patient may proceed to surgery without further cardiac intervention.  2. Coronary artery disease: Patient should continue on Coreg, lisinopril, aspirin, statin, indoor.  3. Essential hypertension: Patient's blood pressure is well controlled. Continue Coreg, Norvasc, lisinopril and Imdur.  4. Severe mitral and tricuspid regurgitation: Continue current cardiac meds and watch fluid status.  5. Ischemic cardiomyopathy, ejection fraction of 35%: Patient's ejection fraction has asked actually improved since last EF of 25% on his current medications. I would continue her current cardiac medications. Please watch fluid status preoperatively and postoperatively.    All the records are reviewed and case discussed with ED provider. Management plans discussed with the patient and she is in agreement.  CODE STATUS: DNR  TOTAL TIME TAKING CARE OF THIS PATIENT: 55 minutes.    Vickie Yang M.D on 03/30/2015 at 12:13 PM  Between 7am to 6pm - Pager - [phone] After 6pm go to [email or web address] - password [email or web address] [facility]  [facility]  Office  [phone]  CC: Primary care physician;  No primary care  provider on file.

## 2015-03-30 NOTE — Care Management Important Message (Signed)
Important Message  Patient Details  Name: Vickie Yang MRN: [id number] Date of Birth: 16-Nov-1924   Medicare Important Message Given:  Yes    Abuk Selleck A, RN 03/30/2015, 5:07 PM

## 2015-03-30 NOTE — ED Provider Notes (Signed)
[facility] Emergency Department Provider Note   ____________________________________________  Time seen: 1100  I have reviewed the triage vital signs and the nursing notes.   HISTORY  Chief Complaint Fall and Hip Pain   History limited by: Poor historian   HPI Vickie Yang is a 81 y.o. female who presents to the emergency department today because of concerns for left hip pain after a fall. The patient herself is unable to give me a great history of what happened that caused the fall. She states she had up and then her left leg gave way. It does not sound that she tripped over anything. She does state she has been having problems with that left leg for a long time. She is not sure how she fell. She did state that she has been having constant left hip pain since the fall. She denies hitting her head or any loss of consciousness.  Past Medical History  Diagnosis Date  . Coronary artery disease   . Hypertension     There are no active problems to display for this patient.   Past Surgical History  Procedure Laterality Date  . Cardiac surgery      No current outpatient prescriptions on file.  Allergies Review of patient's allergies indicates no known allergies.  No family history on file.  Social History Social History  Substance Use Topics  . Smoking status: Former Games developer  . Smokeless tobacco: None  . Alcohol Use: No    Review of Systems  Constitutional: Negative for fever. Cardiovascular: Negative for chest pain. Respiratory: Negative for shortness of breath. Gastrointestinal: Negative for abdominal pain, vomiting and diarrhea. Genitourinary: Negative for dysuria. Musculoskeletal: Positive for left hip pain Skin: Negative for rash. Neurological: Negative for headaches, focal weakness or numbness.   10-point ROS otherwise negative.  ____________________________________________   PHYSICAL EXAM:  VITAL SIGNS: ED Triage Vitals   Enc Vitals Group     BP 03/30/15 1021 141/88 mmHg     Pulse Rate 03/30/15 1021 81     Resp 03/30/15 1021 18     Temp 03/30/15 1021 98.1 F (36.7 C)     Temp Source 03/30/15 1021 Oral     SpO2 03/30/15 1021 95 %     Weight 03/30/15 1021 109 lb 8 oz (49.669 kg)     Height 03/30/15 1021  (1.575 m)     Head Cir --      Peak Flow --      Pain Score 03/30/15 1024 0   Constitutional: Awake and alert. Pleasantly demented Eyes: Conjunctivae are normal. PERRL. Normal extraocular movements. ENT   Head: Normocephalic and atraumatic.   Nose: No congestion/rhinnorhea.   Mouth/Throat: Mucous membranes are moist.   Neck: No stridor. Hematological/Lymphatic/Immunilogical: No cervical lymphadenopathy. Cardiovascular: Normal rate, regular rhythm.  No murmurs, rubs, or gallops. Respiratory: Normal respiratory effort without tachypnea nor retractions. Breath sounds are clear and equal bilaterally. No wheezes/rales/rhonchi. Gastrointestinal: Soft and nontender. No distention.  Genitourinary: Deferred Musculoskeletal: Tenderness to manipulation and palpation of the left hip. Mild deformity. Neurovascularly intact distally. Neurologic:  Normal speech and language. No gross focal neurologic deficits are appreciated.  Skin:  Skin is warm, dry and intact. No rash noted. Psychiatric: Mood and affect are normal. Speech and behavior are normal. Patient exhibits appropriate insight and judgment.  ____________________________________________    LABS (pertinent positives/negatives)  Labs Reviewed  CBC WITH DIFFERENTIAL/PLATELET - Abnormal; Notable for the following:    WBC 11.3 (*)  RBC 3.55 (*)    Hemoglobin 10.1 (*)    HCT 31.7 (*)    RDW 15.9 (*)    Neutro Abs 10.2 (*)    Lymphs Abs 0.6 (*)    All other components within normal limits  BASIC METABOLIC PANEL - Abnormal; Notable for the following:    Glucose, Bld 116 (*)    Calcium 8.7 (*)    GFR calc non Af Amer 55 (*)     Anion gap 3 (*)    All other components within normal limits  TROPONIN I  PROTIME-INR     ____________________________________________   EKG  Pending ____________________________________________    RADIOLOGY  Left hip IMPRESSION: Displaced left trochanteric fracture.   I, Joziyah Roblero, personally viewed and evaluated these images (plain radiographs) as part of my medical decision making. ____________________________________________   PROCEDURES  Procedure(s) performed: None  Critical Care performed: No  ____________________________________________   INITIAL IMPRESSION / ASSESSMENT AND PLAN / ED COURSE  Pertinent labs & imaging results that were available during my care of the patient were reviewed by me and considered in my medical decision making (see chart for details).  Patient presented to the emergency department today with concerns for left leg pain after a fall. X-rays did show a trochanteric fracture. Orthopedics was consulted. Patient will be admitted to the hospital service for further workup and management.  ____________________________________________   FINAL CLINICAL IMPRESSION(S) / ED DIAGNOSES  Final diagnoses:  Weakness  Closed left hip fracture, initial encounter (HCC)     Phineas SemenGraydon Peighton Edgin, MD 03/30/15 1304

## 2015-03-30 NOTE — ED Notes (Signed)
Ems pt from home , unwitnessed fall , pt denies head trauma, left leg shortening noted on arrival with swelling to left hip region, distal pulse present

## 2015-03-30 NOTE — Consult Note (Signed)
ORTHOPAEDIC CONSULTATION  REQUESTING PHYSICIAN: Adrian SaranSital Mody, MD  Chief Complaint:   Left hip pain.  History of Present Illness: Vickie Yang is an 81 y.o. female with a history of significant coronary artery disease and hypertension who lives independently with her grandson and is a household ambulator. She has a long-standing history of left knee pain and was advised to use a walker, but has elected not to. Apparently, her left knee gave way, causing her to lose her balance and fall onto her left hip. She was unable to get up. She was brought to the emergency room complaining of left hip pain. X-rays in the emergency room demonstrated a displaced intertrochanteric fracture of her left hip. She is being admitted to the hospital service at this time in preparation for definitive management of her left hip injury. The patient denies any associated injuries as a result of the fall and denies striking her head or losing consciousness. She also denies any lightheadedness, dizziness, chest pain, or other symptoms that may have precipitated her fall.  Past Medical History  Diagnosis Date  . Coronary artery disease   . Hypertension    Past Surgical History  Procedure Laterality Date  . Cardiac surgery     Social History   Social History  . Marital Status: Widowed    Spouse Name: N/A  . Number of Children: N/A  . Years of Education: N/A   Social History Main Topics  . Smoking status: Former Games developermoker  . Smokeless tobacco: None  . Alcohol Use: No  . Drug Use: None  . Sexual Activity: Not Asked   Other Topics Concern  . None   Social History Narrative  . None   No family history on file. No Known Allergies Prior to Admission medications   Medication Sig Start Date End Date Taking? Authorizing Provider  amLODipine (NORVASC) 10 MG tablet Take 5 mg by mouth daily. 03/04/15  Yes Historical Provider, MD  aspirin EC 81 MG tablet  Take 81 mg by mouth daily.   Yes Historical Provider, MD  atorvastatin (LIPITOR) 20 MG tablet Take 20 mg by mouth daily. 03/21/15  Yes Historical Provider, MD  busPIRone (BUSPAR) 5 MG tablet Take 5 mg by mouth daily. 03/21/15  Yes Historical Provider, MD  carvedilol (COREG) 3.125 MG tablet Take 3.125 mg by mouth 2 (two) times daily with a meal. 03/04/15  Yes Historical Provider, MD  ibuprofen (ADVIL,MOTRIN) 200 MG tablet Take 200 mg by mouth every 6 (six) hours as needed. For pain.   Yes Historical Provider, MD  isosorbide dinitrate (ISORDIL) 30 MG tablet Take 30 mg by mouth daily. 03/21/15  Yes Historical Provider, MD  omeprazole (PRILOSEC) 20 MG capsule Take 20 mg by mouth 2 (two) times daily. 02/17/15  Yes Historical Provider, MD  sertraline (ZOLOFT) 25 MG tablet Take 25 mg by mouth daily. 03/21/15  Yes Historical Provider, MD  vitamin B-12 (CYANOCOBALAMIN) 500 MCG tablet Take 500 mcg by mouth daily. 02/17/15  Yes Historical Provider, MD   Dg Chest Port 1 View  03/30/2015  CLINICAL DATA:  Weakness hypertension. EXAM: PORTABLE CHEST 1 VIEW COMPARISON:  Radiograph 09/1910 FINDINGS: Sternotomy wires overlie enlarged cardiac silhouette. Large rounded retrocardiac density likely represents hiatal hernia. Lungs are hyperinflated. There is chronic bronchitic markings. Mild basilar scarring on the LEFT. IMPRESSION: 1. No acute cardiopulmonary findings. 2. Cardiomegaly and hyperinflated lungs. 3. Large hiatal hernia. Electronically Signed   By: Genevive BiStewart  Edmunds M.D.   On: 03/30/2015 11:33   Dg  Hip Unilat With Pelvis 2-3 Views Left  03/30/2015  CLINICAL DATA:  Fall with left hip pain. EXAM: DG HIP (WITH OR WITHOUT PELVIS) 2-3V LEFT COMPARISON:  12/01/2012 FINDINGS: There is diffuse decreased bone mineralization. There are mild symmetric degenerative changes of the spine. There is a displaced left trochanteric fracture with superior anterior displacement of the distal fragment an exaggerated coxa vera  deformity about the fracture site. Moderate degenerative changes of the spine. Multiple pelvic phleboliths. Atherosclerotic disease. IMPRESSION: Displaced left trochanteric fracture. Electronically Signed   By: Elberta Fortis M.D.   On: 03/30/2015 11:29    Positive ROS: All other systems have been reviewed and were otherwise negative with the exception of those mentioned in the HPI and as above.  Physical Exam: General:  Alert, no acute distress Psychiatric:  Patient exhibits normal mood and affect   Cardiovascular:  No pedal edema Respiratory:  No wheezing, non-labored breathing GI:  Abdomen is soft and non-tender Skin:  No lesions in the area of chief complaint Neurologic:  Sensation intact distally Lymphatic:  No axillary or cervical lymphadenopathy  Orthopedic Exam:  Orthopedic examination is limited to the left hip and lower extremity. The patient's left lower extremity is shortened and externally rotated as compared to the right lower extremity. Skin inspection around the left hip is unremarkable. She has mild-moderate tenderness to palpation over the trochanteric region laterally. This pain is reproduced with any attempt at active or passive motion of the left hip. She is able to actively dorsiflex and plantarflex her toes and ankle. Sensation is intact to light touch to all distributions of her left lower extremity and foot. She has good capillary refill to her left foot.  X-rays:  X-rays of the pelvis and left hip are available for review. These films demonstrate a displaced three-part intertrochanteric fracture of the left hip with varus alignment. The hip joint itself appears to be well-maintained. No lytic lesions or other abnormalities are identified.  Assessment: Displaced 3-part intertrochanteric fracture left hip.  Plan: The treatment options are discussed with the patient and her daughter, who is at the patient's bedside in the room, specifically an intramedullary nail  fixation of the left intertrochanteric hip fracture. This procedure has been discussed in detail, as have the potential risks (including bleeding, infection, nerve and/or blood vessel injury, persistent or recurrent pain, stiffness of the hip, malunion and/or nonunion, leg length inequality, need for further surgery, blood clots, strokes, heart attacks and/or arrhythmias, etc.) and benefits. The patient and her family state their understanding and wish to proceed. A formal written consent will be obtained by the nursing staff.  Thank you for ask me to participate in the care of this most pleasant woman. I will be happy to follow her with you.   Vickie Amos, MD  Beeper #:  [phone]  03/30/2015 12:55 PM

## 2015-03-31 ENCOUNTER — Inpatient Hospital Stay: Payer: Medicare Other

## 2015-03-31 ENCOUNTER — Encounter
Admission: EM | Disposition: A | Discharge status: Skilled Nursing Facility | Payer: Self-pay | Source: Home / Self Care | Attending: Internal Medicine

## 2015-03-31 ENCOUNTER — Inpatient Hospital Stay: Payer: Medicare Other | Admitting: Anesthesiology

## 2015-03-31 ENCOUNTER — Encounter: Payer: Self-pay | Admitting: *Deleted

## 2015-03-31 HISTORY — PX: INTRAMEDULLARY (IM) NAIL INTERTROCHANTERIC: SHX5875

## 2015-03-31 LAB — BASIC METABOLIC PANEL
Anion gap: 4 — ABNORMAL LOW (ref 5–15)
BUN: 20 mg/dL (ref 6–20)
CHLORIDE: 108 mmol/L (ref 101–111)
CO2: 28 mmol/L (ref 22–32)
Calcium: 8.9 mg/dL (ref 8.9–10.3)
Creatinine, Ser: 1.08 mg/dL — ABNORMAL HIGH (ref 0.44–1.00)
GFR calc Af Amer: 52 mL/min — ABNORMAL LOW (ref >60)
GFR calc non Af Amer: 45 mL/min — ABNORMAL LOW (ref >60)
GLUCOSE: 119 mg/dL — AB (ref 65–99)
POTASSIUM: 4.3 mmol/L (ref 3.5–5.1)
Sodium: 140 mmol/L (ref 135–145)

## 2015-03-31 LAB — CBC
HEMATOCRIT: 28.2 % — AB (ref 35.0–47.0)
Hemoglobin: 9.2 g/dL — ABNORMAL LOW (ref 12.0–16.0)
MCH: 29.3 pg (ref 26.0–34.0)
MCHC: 32.7 g/dL (ref 32.0–36.0)
MCV: 89.7 fL (ref 80.0–100.0)
Platelets: 156 10*3/uL (ref 150–440)
RBC: 3.15 MIL/uL — ABNORMAL LOW (ref 3.80–5.20)
RDW: 16.4 % — AB (ref 11.5–14.5)
WBC: 7.4 10*3/uL (ref 3.6–11.0)

## 2015-03-31 SURGERY — FIXATION, FRACTURE, INTERTROCHANTERIC, WITH INTRAMEDULLARY ROD
Anesthesia: Spinal | Laterality: Left

## 2015-03-31 MED ORDER — ACETAMINOPHEN 325 MG PO TABS
650.0000 mg | ORAL_TABLET | Freq: Four times a day (QID) | ORAL | Status: DC | PRN
Start: 2015-03-31 — End: 2015-04-03
  Administered 2015-04-02 – 2015-04-03 (×3): 650 mg via ORAL
  Filled 2015-03-31 (×3): qty 2

## 2015-03-31 MED ORDER — NEOMYCIN-POLYMYXIN B GU 40-200000 IR SOLN
Status: DC | PRN
  Administered 2015-03-31: 2 mL

## 2015-03-31 MED ORDER — SERTRALINE HCL 50 MG PO TABS
25.0000 mg | ORAL_TABLET | Freq: Every day | ORAL | Status: DC
  Administered 2015-03-31 – 2015-04-03 (×4): 25 mg via ORAL
  Filled 2015-03-31 (×2): qty 2
  Filled 2015-03-31 (×2): qty 1

## 2015-03-31 MED ORDER — FENTANYL CITRATE (PF) 100 MCG/2ML IJ SOLN: 25.0000 ug | INTRAMUSCULAR | Status: DC | PRN

## 2015-03-31 MED ORDER — BUPIVACAINE-EPINEPHRINE (PF) 0.5% -1:200000 IJ SOLN
INTRAMUSCULAR | Status: AC
  Filled 2015-03-31: qty 30

## 2015-03-31 MED ORDER — ONDANSETRON HCL 4 MG/2ML IJ SOLN: 4.0000 mg | Freq: Once | INTRAMUSCULAR | Status: DC | PRN

## 2015-03-31 MED ORDER — MIDAZOLAM HCL 5 MG/5ML IJ SOLN
INTRAMUSCULAR | Status: DC | PRN
  Administered 2015-03-31: 1 mg via INTRAVENOUS

## 2015-03-31 MED ORDER — KCL IN DEXTROSE-NACL 20-5-0.9 MEQ/L-%-% IV SOLN
INTRAVENOUS | Status: DC
  Administered 2015-03-31 – 2015-04-01 (×2): via INTRAVENOUS
  Filled 2015-03-31 (×7): qty 1000

## 2015-03-31 MED ORDER — DIPHENHYDRAMINE HCL 12.5 MG/5ML PO ELIX: 12.5000 mg | ORAL_SOLUTION | ORAL | Status: DC | PRN

## 2015-03-31 MED ORDER — EPHEDRINE SULFATE 50 MG/ML IJ SOLN
INTRAMUSCULAR | Status: DC | PRN
  Administered 2015-03-31: 10 mg via INTRAVENOUS

## 2015-03-31 MED ORDER — OXYCODONE HCL 5 MG PO TABS
5.0000 mg | ORAL_TABLET | ORAL | Status: DC | PRN
  Administered 2015-03-31 – 2015-04-01 (×2): 5 mg via ORAL
  Filled 2015-03-31 (×3): qty 1

## 2015-03-31 MED ORDER — ASPIRIN EC 81 MG PO TBEC
81.0000 mg | DELAYED_RELEASE_TABLET | Freq: Every day | ORAL | Status: DC
  Administered 2015-04-01 – 2015-04-03 (×3): 81 mg via ORAL
  Filled 2015-03-31 (×3): qty 1

## 2015-03-31 MED ORDER — NEOMYCIN-POLYMYXIN B GU 40-200000 IR SOLN
Status: AC
  Filled 2015-03-31: qty 2

## 2015-03-31 MED ORDER — BISACODYL 10 MG RE SUPP: 10.0000 mg | Freq: Every day | RECTAL | Status: DC | PRN

## 2015-03-31 MED ORDER — ONDANSETRON HCL 4 MG PO TABS
4.0000 mg | ORAL_TABLET | Freq: Four times a day (QID) | ORAL | Status: DC | PRN
  Administered 2015-04-02: 4 mg via ORAL
  Filled 2015-03-31: qty 1

## 2015-03-31 MED ORDER — PROPOFOL 10 MG/ML IV BOLUS
INTRAVENOUS | Status: DC | PRN
  Administered 2015-03-31: 30 mg via INTRAVENOUS
  Administered 2015-03-31: 20 mg via INTRAVENOUS

## 2015-03-31 MED ORDER — CEFAZOLIN SODIUM-DEXTROSE 2-3 GM-% IV SOLR
2.0000 g | Freq: Four times a day (QID) | INTRAVENOUS | Status: AC
  Administered 2015-03-31 – 2015-04-01 (×3): 2 g via INTRAVENOUS
  Filled 2015-03-31 (×3): qty 50

## 2015-03-31 MED ORDER — AMLODIPINE BESYLATE 5 MG PO TABS
5.0000 mg | ORAL_TABLET | Freq: Every day | ORAL | Status: DC
  Administered 2015-04-01 – 2015-04-03 (×3): 5 mg via ORAL
  Filled 2015-03-31 (×3): qty 1

## 2015-03-31 MED ORDER — PHENYLEPHRINE HCL 10 MG/ML IJ SOLN
INTRAMUSCULAR | Status: DC | PRN
  Administered 2015-03-31 (×7): 100 ug via INTRAVENOUS
  Administered 2015-03-31: 200 ug via INTRAVENOUS
  Administered 2015-03-31 (×2): 100 ug via INTRAVENOUS

## 2015-03-31 MED ORDER — BUPIVACAINE-EPINEPHRINE 0.5% -1:200000 IJ SOLN
INTRAMUSCULAR | Status: DC | PRN
  Administered 2015-03-31: 30 mL

## 2015-03-31 MED ORDER — ONDANSETRON HCL 4 MG/2ML IJ SOLN
4.0000 mg | Freq: Four times a day (QID) | INTRAMUSCULAR | Status: DC | PRN
Start: 2015-03-31 — End: 2015-04-03

## 2015-03-31 MED ORDER — METOCLOPRAMIDE HCL 5 MG PO TABS: 5.0000 mg | ORAL_TABLET | Freq: Three times a day (TID) | ORAL | Status: DC | PRN

## 2015-03-31 MED ORDER — FLEET ENEMA 7-19 GM/118ML RE ENEM: 1.0000 | ENEMA | Freq: Once | RECTAL | Status: DC | PRN

## 2015-03-31 MED ORDER — ACETAMINOPHEN 10 MG/ML IV SOLN
INTRAVENOUS | Status: AC
  Filled 2015-03-31: qty 100

## 2015-03-31 MED ORDER — CARVEDILOL 3.125 MG PO TABS
3.1250 mg | ORAL_TABLET | Freq: Two times a day (BID) | ORAL | Status: DC
  Administered 2015-03-31 – 2015-04-03 (×5): 3.125 mg via ORAL
  Filled 2015-03-31 (×5): qty 1

## 2015-03-31 MED ORDER — HYDROMORPHONE HCL 1 MG/ML IJ SOLN
0.5000 mg | INTRAMUSCULAR | Status: DC | PRN
  Administered 2015-03-31: 0.5 mg via INTRAVENOUS
  Filled 2015-03-31: qty 1

## 2015-03-31 MED ORDER — PANTOPRAZOLE SODIUM 40 MG PO TBEC
40.0000 mg | DELAYED_RELEASE_TABLET | Freq: Two times a day (BID) | ORAL | Status: DC
  Administered 2015-03-31 – 2015-04-03 (×6): 40 mg via ORAL
  Filled 2015-03-31 (×6): qty 1

## 2015-03-31 MED ORDER — ACETAMINOPHEN 650 MG RE SUPP: 650.0000 mg | Freq: Four times a day (QID) | RECTAL | Status: DC | PRN

## 2015-03-31 MED ORDER — PROPOFOL 500 MG/50ML IV EMUL
INTRAVENOUS | Status: DC | PRN
  Administered 2015-03-31: 25 ug/kg/min via INTRAVENOUS

## 2015-03-31 MED ORDER — METOCLOPRAMIDE HCL 5 MG/ML IJ SOLN: 5.0000 mg | Freq: Three times a day (TID) | INTRAMUSCULAR | Status: DC | PRN

## 2015-03-31 MED ORDER — ACETAMINOPHEN 10 MG/ML IV SOLN
INTRAVENOUS | Status: DC | PRN
  Administered 2015-03-31: 1000 mg via INTRAVENOUS

## 2015-03-31 MED ORDER — ATORVASTATIN CALCIUM 20 MG PO TABS
20.0000 mg | ORAL_TABLET | Freq: Every day | ORAL | Status: DC
  Administered 2015-03-31 – 2015-04-03 (×4): 20 mg via ORAL
  Filled 2015-03-31 (×4): qty 1

## 2015-03-31 MED ORDER — MAGNESIUM HYDROXIDE 400 MG/5ML PO SUSP
30.0000 mL | Freq: Every day | ORAL | Status: DC | PRN
  Administered 2015-04-01 – 2015-04-02 (×2): 30 mL via ORAL
  Filled 2015-03-31: qty 30

## 2015-03-31 MED ORDER — BUSPIRONE HCL 5 MG PO TABS
5.0000 mg | ORAL_TABLET | Freq: Every day | ORAL | Status: DC
  Administered 2015-03-31 – 2015-04-03 (×4): 5 mg via ORAL
  Filled 2015-03-31 (×4): qty 1

## 2015-03-31 MED ORDER — VITAMIN B-12 1000 MCG PO TABS
500.0000 ug | ORAL_TABLET | Freq: Every day | ORAL | Status: DC
  Administered 2015-04-01 – 2015-04-03 (×3): 500 ug via ORAL
  Filled 2015-03-31: qty 2
  Filled 2015-03-31 (×2): qty 1

## 2015-03-31 MED ORDER — DOCUSATE SODIUM 100 MG PO CAPS
100.0000 mg | ORAL_CAPSULE | Freq: Two times a day (BID) | ORAL | Status: DC
  Administered 2015-03-31 – 2015-04-03 (×6): 100 mg via ORAL
  Filled 2015-03-31 (×6): qty 1

## 2015-03-31 MED ORDER — ENOXAPARIN SODIUM 40 MG/0.4ML ~~LOC~~ SOLN
40.0000 mg | SUBCUTANEOUS | Status: DC
  Administered 2015-04-01: 40 mg via SUBCUTANEOUS
  Filled 2015-03-31: qty 0.4

## 2015-03-31 SURGICAL SUPPLY — 43 items
BIT DRILL 4.3MMS DISTAL GRDTED (BIT) ×1 IMPLANT
BNDG COHESIVE 4X5 TAN STRL (GAUZE/BANDAGES/DRESSINGS) ×6 IMPLANT
BNDG COHESIVE 6X5 TAN STRL LF (GAUZE/BANDAGES/DRESSINGS) ×3 IMPLANT
CANISTER SUCT 1200ML W/VALVE (MISCELLANEOUS) ×3 IMPLANT
CHLORAPREP W/TINT 26ML (MISCELLANEOUS) ×6 IMPLANT
DECANTER SPIKE VIAL GLASS SM (MISCELLANEOUS) ×3 IMPLANT
DRAPE C-ARMOR (DRAPES) ×3 IMPLANT
DRAPE SHEET LG 3/4 BI-LAMINATE (DRAPES) ×6 IMPLANT
DRAPE SURG 17X11 SM STRL (DRAPES) ×3 IMPLANT
DRAPE U-SHAPE 47X51 STRL (DRAPES) ×3 IMPLANT
DRILL 4.3MMS DISTAL GRADUATED (BIT) ×3
ELECT CAUTERY BLADE 6.4 (BLADE) ×3 IMPLANT
GAUZE SPONGE 4X4 12PLY STRL (GAUZE/BANDAGES/DRESSINGS) ×3 IMPLANT
GLOVE BIO SURGEON STRL SZ7.5 (GLOVE) ×6 IMPLANT
GLOVE BIO SURGEON STRL SZ8 (GLOVE) ×6 IMPLANT
GLOVE INDICATOR 8.0 STRL GRN (GLOVE) ×3 IMPLANT
GOWN STRL REUS W/ TWL LRG LVL3 (GOWN DISPOSABLE) ×2 IMPLANT
GOWN STRL REUS W/ TWL XL LVL3 (GOWN DISPOSABLE) ×1 IMPLANT
GOWN STRL REUS W/TWL LRG LVL3 (GOWN DISPOSABLE) ×4
GOWN STRL REUS W/TWL XL LVL3 (GOWN DISPOSABLE) ×2
GUIDE PIN ×3 IMPLANT
GUIDEPIN VERSANAIL DSP 3.2X444 ×3 IMPLANT
GUIDEWIRE BALL NOSE 100CM (WIRE) ×3 IMPLANT
HFN LAG SCREW 10.5MM X 85MM (Screw) ×3 IMPLANT
HFN LH 130 DEG 11MM X 380MM (Orthopedic Implant) ×3 IMPLANT
MAT BLUE FLOOR 46X72 FLO (MISCELLANEOUS) IMPLANT
NEEDLE FILTER BLUNT 18X 1/2SAF (NEEDLE) ×2
NEEDLE FILTER BLUNT 18X1 1/2 (NEEDLE) ×1 IMPLANT
NEEDLE HYPO 25X1 1.5 SAFETY (NEEDLE) ×3 IMPLANT
NS IRRIG 500ML POUR BTL (IV SOLUTION) ×3 IMPLANT
PACK HIP COMPR (MISCELLANEOUS) ×3 IMPLANT
PAD GROUND ADULT SPLIT (MISCELLANEOUS) ×3 IMPLANT
SCREW BONE CORTICAL 5.0X40 (Screw) ×3 IMPLANT
SLEEVE PROTECTION STRL DISP (MISCELLANEOUS) ×3 IMPLANT
STAPLER SKIN PROX 35W (STAPLE) ×3 IMPLANT
STRAP SAFETY BODY (MISCELLANEOUS) ×3 IMPLANT
SUT VIC AB 1 CT1 36 (SUTURE) ×3 IMPLANT
SUT VIC AB 2-0 CT1 (SUTURE) ×3 IMPLANT
SUT VIC AB 2-0 CT1 27 (SUTURE) ×2
SUT VIC AB 2-0 CT1 TAPERPNT 27 (SUTURE) ×1 IMPLANT
SYR 30ML LL (SYRINGE) ×3 IMPLANT
SYRINGE 10CC LL (SYRINGE) ×3 IMPLANT
TAPE MICROFOAM 4IN (TAPE) ×3 IMPLANT

## 2015-03-31 NOTE — Clinical Social Work Note (Signed)
Clinical Social Work Assessment  Patient Details  Name: Vickie Yang MRN: [id number] Date of Birth: May 17, 1924  Date of referral:  03/31/15               Reason for consult:  Facility Placement                Permission sought to share information with:  Chartered certified accountant granted to share information::  Yes, Verbal Permission Granted  Name::      Vickie Yang::   Vickie Yang   Relationship::     Contact Information:     Housing/Transportation Living arrangements for the past 2 months:  [place] of Information:  Adult Children Patient Interpreter Needed:  None Criminal Activity/Legal Involvement Pertinent to Current Situation/Hospitalization:  No - Comment as needed Significant Relationships:  Adult Children, Other Family Members Lives with:  Other (Comment) (Grandson Jordon ) Do you feel safe going back to the place where you live?  Yes Need for family participation in patient care:  Yes (Comment)  Care giving concerns: Patient lives with her grandson Martinique in [place].    Social Worker assessment / plan: Holiday representative (CSW) received SNF consult. Patient had surgery today for a hip fracture. CSW met with patient's 2 daughter's Verdene Lennert and Santiago Glad at bedside. CSW introduced self and explained role of CSW department. Patient was brought back to the room from PACU. Daughters reported that patient lives with her 58 y.o grandson [place] in [place]. Per daughters while Lovie Chol works other family members are with patient. Per daughters family provides 24/7 care for patient. CSW explained that PT will work with patient tomorrow and make a recommendation of home health or SNF. Daughters prefer SNF and reported that patient has been to [facility] in the past. SNF list was provided. CSW also discussed patient's insurance [employer] may require a co-pay. Daughters verbalized their understanding. Daughters reported that  patient does not have a HPOA and they would like to work on an Scientist, physiological. CSW made daughters aware that patient has to be alert enough to sign paper work. CSW asked secretary to call in chaplin consult for advanced directive.   FL2 complete and faxed out. CSW will follow up with bed offers tomorrow.     Employment status:  Retired Nurse, adult PT Recommendations:  Not assessed at this time Information / Referral to community resources:  [place]  Patient/Family's Response to care:  Patient's daughters are agreeable to [employer].   Patient/Family's Understanding of and Emotional Response to Diagnosis, Current Treatment, and Prognosis: Patient and daughters were pleasant.   Emotional Assessment Appearance:  Appears stated age Attitude/Demeanor/Rapport:    Affect (typically observed):  Accepting, Adaptable, Pleasant Orientation:  Oriented to Self, Fluctuating Orientation (Suspected and/or reported Sundowners) Alcohol / Substance use:  Not Applicable Psych involvement (Current and /or in the community):  No (Comment)  Discharge Needs  Concerns to be addressed:  Discharge Planning Concerns Readmission within the last 30 days:  No Current discharge risk:  Dependent with Mobility Barriers to Discharge:  Continued Medical Work up   Loralyn Freshwater, LCSW 03/31/2015, 4:16 PM

## 2015-03-31 NOTE — Progress Notes (Signed)
Initial Nutrition Assessment    INTERVENTION:  Meals and snacks: Await diet progression   NUTRITION DIAGNOSIS:   Inadequate oral intake related to acute illness as evidenced by NPO status.    GOAL:   Patient will meet greater than or equal to 90% of their needs    MONITOR:    (Energy intake)  REASON FOR ASSESSMENT:   Consult Poor PO  ASSESSMENT:      Pt admitted following fall with left hip fracture.    Past Medical History  Diagnosis Date  . Coronary artery disease   . Hypertension     Current Nutrition: NPO  Food/Nutrition-Related History: pt unavailable to speak with this am during rounds.   Scheduled Medications:  . aspirin EC  81 mg Oral Daily  . atorvastatin  20 mg Oral q1800  . carvedilol  3.125 mg Oral BID WC  .  ceFAZolin (ANCEF) IV  2 g Intravenous 30 min Pre-Op  . furosemide  40 mg Oral Once  . isosorbide mononitrate  30 mg Oral Daily  . lisinopril  10 mg Oral Daily    Continuous Medications:  . sodium chloride 50 mL/hr at 03/30/15 1551     Electrolyte/Renal Profile and Glucose Profile:   Recent Labs Lab 03/30/15 1210 03/31/15 0432  NA 140 140  K 4.4 4.3  CL 110 108  CO2 27 28  BUN 20 20  CREATININE 0.91 1.08*  CALCIUM 8.7* 8.9  GLUCOSE 116* 119*    Gastrointestinal Profile: Last BM:11/26   Nutrition-Focused Physical Exam Findings:  Unable to complete Nutrition-Focused physical exam at this time.     Weight Change: unable to clarify wt loss with pt at this time    Diet Order:  Diet NPO time specified  Skin:   reviewed    Height:   Ht Readings from Last 1 Encounters:  03/30/15 5\' 2"  (1.575 m)    Weight:   Wt Readings from Last 1 Encounters:  03/30/15 109 lb 8 oz (49.669 kg)    Ideal Body Weight:     BMI:  Body mass index is 20.02 kg/(m^2).  Estimated Nutritional Needs:   Kcal:  BEE 888 kcals (IF 1.1-1.3, AF 1.2) [id number] kcals/d.   Protein:  (1.1-1.3 g/kg) 55-65 g/d  Fluid:  (25-8330ml/kg)  1250-15700ml/d  EDUCATION NEEDS:   No education needs identified at this time  MODERATE Care Level  Vickie Yang, RD, LDN [phone] (pager)

## 2015-03-31 NOTE — Transfer of Care (Signed)
Immediate Anesthesia Transfer of Care Note  Patient: Vickie Yang  Procedure(s) Performed: Procedure(s): INTRAMEDULLARY (IM) NAIL INTERTROCHANTRIC (Left)  Patient Location: PACU  Anesthesia Type:Spinal  Level of Consciousness: awake and alert   Airway & Oxygen Therapy: Patient Spontanous Breathing  Post-op Assessment: Report given to RN and Post -op Vital signs reviewed and stable  Post vital signs: stable  Last Vitals:  Filed Vitals:   03/31/15 1229 03/31/15 1450  BP: 94/61 90/52  Pulse: 71 67  Temp: 37.3 C 36.6 C  Resp: 16 17    Complications: No apparent anesthesia complications

## 2015-03-31 NOTE — Clinical Social Work Placement (Signed)
   CLINICAL SOCIAL WORK PLACEMENT  NOTE  Date:  03/31/2015  Patient Details  Name: Vickie Yang MRN: [id number] Date of Birth: Feb 23, 1925  Clinical Social Work is seeking post-discharge placement for this patient at the Skilled  Nursing Facility level of care (*CSW will initial, date and re-position this form in  chart as items are completed):  Yes   Patient/family provided with [facility] Clinical Social Work Department's list of facilities offering this level of care within the geographic area requested by the patient (or if unable, by the patient's family).  Yes   Patient/family informed of their freedom to choose among providers that offer the needed level of care, that participate in Medicare, Medicaid or managed care program needed by the patient, have an available bed and are willing to accept the patient.  Yes   Patient/family informed of [facility]'s ownership interest in [facility] and [facility], as well as of the fact that they are under no obligation to receive care at these facilities.  PASRR submitted to EDS on       PASRR number received on       Existing PASRR number confirmed on 03/31/15     FL2 transmitted to all facilities in geographic area requested by pt/family on 03/31/15     FL2 transmitted to all facilities within larger geographic area on       Patient informed that his/her managed care company has contracts with or will negotiate with certain facilities, including the following:            Patient/family informed of bed offers received.  Patient chooses bed at       Physician recommends and patient chooses bed at      Patient to be transferred to   on  .  Patient to be transferred to facility by       Patient family notified on   of transfer.  Name of family member notified:        PHYSICIAN       Additional Comment:    _______________________________________________ Haig ProphetMorgan, Greenly Rarick G, LCSW 03/31/2015, 4:12 PM

## 2015-03-31 NOTE — Anesthesia Preprocedure Evaluation (Signed)
Anesthesia Evaluation  Patient identified by MRN, date of birth, ID band Patient awake    Reviewed: Allergy & Precautions, H&P , NPO status , Patient's Chart, lab work & pertinent test results, reviewed documented beta blocker date and time   Airway Mallampati: II  TM Distance: >3 FB Neck ROM: full    Dental no notable dental hx.    Pulmonary neg pulmonary ROS, former smoker,    Pulmonary exam normal breath sounds clear to auscultation       Cardiovascular Exercise Tolerance: Good hypertension, negative cardio ROS   Rhythm:regular Rate:Normal     Neuro/Psych negative neurological ROS  negative psych ROS   GI/Hepatic negative GI ROS, Neg liver ROS,   Endo/Other  negative endocrine ROS  Renal/GU negative Renal ROS  negative genitourinary   Musculoskeletal   Abdominal   Peds  Hematology negative hematology ROS (+)   Anesthesia Other Findings   Reproductive/Obstetrics negative OB ROS                             Anesthesia Physical Anesthesia Plan  ASA: III  Anesthesia Plan: Spinal   Post-op Pain Management:    Induction:   Airway Management Planned:   Additional Equipment:   Intra-op Plan:   Post-operative Plan:   Informed Consent: I have reviewed the patients History and Physical, chart, labs and discussed the procedure including the risks, benefits and alternatives for the proposed anesthesia with the patient or authorized representative who has indicated his/her understanding and acceptance.   Dental Advisory Given  Plan Discussed with: CRNA  Anesthesia Plan Comments:         Anesthesia Quick Evaluation

## 2015-03-31 NOTE — Progress Notes (Signed)
Pt left earlier for the OR. Transported by OR orderly.  2 daughters with the pt when she left for the OR

## 2015-03-31 NOTE — NC FL2 (Signed)
[place] MEDICAID FL2 LEVEL OF CARE SCREENING TOOL     IDENTIFICATION  Patient Name: Vickie Yang Birthdate: 1924-09-06 Sex: female Admission Date (Current Location): 03/30/2015  [place] and [place] Number:  [facility] )   Facility and Address:  [facility], [address] [place], [place] [address]      Provider Number: [id number]  Attending Physician Name and Address:  Delfino Lovett, MD  Relative Name and Phone Number:       Current Level of Care: Hospital Recommended Level of Care: Skilled Nursing Facility Prior Approval Number:    Date Approved/Denied:   PASRR Number:  [id number])  Discharge Plan: SNF    Current Diagnoses: Patient Active Problem List   Diagnosis Date Noted  . Hip fracture (HCC) 03/30/2015    Orientation ACTIVITIES/SOCIAL BLADDER RESPIRATION    Self  Active Continent, Indwelling catheter Normal  BEHAVIORAL SYMPTOMS/MOOD NEUROLOGICAL BOWEL NUTRITION STATUS   (none )  (none ) Continent Diet (NPO for surgery )  PHYSICIAN VISITS COMMUNICATION OF NEEDS Height & Weight Skin  30 days Verbally  (157.5 cm) 109 lbs. Surgical wounds (Incision: Left Hip. )          AMBULATORY STATUS RESPIRATION    Assist extensive Normal      Personal Care Assistance Level of Assistance  Bathing, Feeding, Dressing Bathing Assistance: Limited assistance Feeding assistance: Independent Dressing Assistance: Limited assistance      Functional Limitations Info  Sight, Hearing, Speech Sight Info: Adequate Hearing Info: Adequate Speech Info: Adequate       SPECIAL CARE FACTORS FREQUENCY  PT (By licensed PT), OT (By licensed OT)     PT Frequency:  (5) OT Frequency:  (5)           Additional Factors Info  Code Status, Psychotropic Code Status Info:  (DNR )   Psychotropic Info:  (Zoloft )         Current Medications (03/31/2015):  This is the current hospital active medication list Current  Facility-Administered Medications  Medication Dose Route Frequency Provider Last Rate Last Dose  . acetaminophen (TYLENOL) tablet 650 mg  650 mg Oral Q6H PRN Christena Flake, MD       Or  . acetaminophen (TYLENOL) suppository 650 mg  650 mg Rectal Q6H PRN Christena Flake, MD      . alum & mag hydroxide-simeth (MAALOX/MYLANTA) 200-200-20 MG/5ML suspension 30 mL  30 mL Oral Q6H PRN Adrian Saran, MD      . amLODipine (NORVASC) tablet 5 mg  5 mg Oral Daily Christena Flake, MD      . Melene Muller ON 04/01/2015] aspirin EC tablet 81 mg  81 mg Oral Daily Christena Flake, MD      . atorvastatin (LIPITOR) tablet 20 mg  20 mg Oral Daily Christena Flake, MD      . bisacodyl (DULCOLAX) suppository 10 mg  10 mg Rectal Daily PRN Christena Flake, MD      . busPIRone (BUSPAR) tablet 5 mg  5 mg Oral Daily Christena Flake, MD      . carvedilol (COREG) tablet 3.125 mg  3.125 mg Oral BID WC Christena Flake, MD      . ceFAZolin (ANCEF) IVPB 2 g/50 mL premix  2 g Intravenous Q6H Christena Flake, MD      . dextrose 5 % and 0.9 % NaCl with KCl 20 mEq/L infusion   Intravenous Continuous Christena Flake, MD      .  diphenhydrAMINE (BENADRYL) 12.5 MG/5ML elixir 12.5-25 mg  12.5-25 mg Oral Q4H PRN Christena FlakeJohn J Poggi, MD      . docusate sodium (COLACE) capsule 100 mg  100 mg Oral BID Christena FlakeJohn J Poggi, MD      . Melene Muller[START ON 04/01/2015] enoxaparin (LOVENOX) injection 40 mg  40 mg Subcutaneous Q24H Christena FlakeJohn J Poggi, MD      . furosemide (LASIX) tablet 40 mg  40 mg Oral Once Adrian SaranSital Mody, MD   40 mg at 03/30/15 1552  . HYDROmorphone (DILAUDID) injection 0.5 mg  0.5 mg Intravenous Q2H PRN Christena FlakeJohn J Poggi, MD      . isosorbide mononitrate (IMDUR) 24 hr tablet 30 mg  30 mg Oral Daily Adrian SaranSital Mody, MD   30 mg at 03/31/15 0734  . lisinopril (PRINIVIL,ZESTRIL) tablet 10 mg  10 mg Oral Daily Adrian SaranSital Mody, MD   10 mg at 03/30/15 1554  . magnesium hydroxide (MILK OF MAGNESIA) suspension 30 mL  30 mL Oral Daily PRN Christena FlakeJohn J Poggi, MD      . metoCLOPramide (REGLAN) tablet 5-10 mg  5-10 mg Oral Q8H PRN  Christena FlakeJohn J Poggi, MD       Or  . metoCLOPramide (REGLAN) injection 5-10 mg  5-10 mg Intravenous Q8H PRN Christena FlakeJohn J Poggi, MD      . ondansetron [facility](ZOFRAN) tablet 4 mg  4 mg Oral Q6H PRN Christena FlakeJohn J Poggi, MD       Or  . ondansetron (ZOFRAN) injection 4 mg  4 mg Intravenous Q6H PRN Christena FlakeJohn J Poggi, MD      . oxyCODONE (Oxy IR/ROXICODONE) immediate release tablet 5-10 mg  5-10 mg Oral Q3H PRN Christena FlakeJohn J Poggi, MD      . pantoprazole (PROTONIX) EC tablet 40 mg  40 mg Oral BID Christena FlakeJohn J Poggi, MD      . senna-docusate (Senokot-S) tablet 1 tablet  1 tablet Oral QHS PRN Adrian SaranSital Mody, MD      . sertraline (ZOLOFT) tablet 25 mg  25 mg Oral Daily Christena FlakeJohn J Poggi, MD      . sodium phosphate (FLEET) 7-19 GM/118ML enema 1 enema  1 enema Rectal Once PRN Christena FlakeJohn J Poggi, MD      . Melene Muller[START ON 04/01/2015] vitamin B-12 (CYANOCOBALAMIN) tablet 500 mcg  500 mcg Oral Daily Christena FlakeJohn J Poggi, MD         Discharge Medications: Please see discharge summary for a list of discharge medications.  Relevant Imaging Results:  Relevant Lab Results:  Recent Labs    Additional Information  (SSN: [id number])  Haig ProphetMorgan, Patrina Andreas G, LCSW

## 2015-03-31 NOTE — Progress Notes (Signed)
   03/31/15 1334  Clinical Encounter Type  Visited With Patient not available  Visit Type Initial  Referral From Nurse  Consult/Referral To Chaplain  Patient was in surgery at time of visit.   Chaplain Koby Pickup [phone]

## 2015-03-31 NOTE — Progress Notes (Signed)
Health Directive Education completed.  No notary was available but told Vickie Yang we would bring witnesses and a notary in the morning as soon as one is available.  She initialed paperwork while I was with her. Chaplain Ronney Liononna S Yuvan Medinger Ext [phone]

## 2015-03-31 NOTE — Progress Notes (Signed)
[facility] [place] [facility]   PATIENT NAME: Vickie Yang    MR#:  [id number]  DATE OF BIRTH:  04-26-1925  SUBJECTIVE:  CHIEF COMPLAINT:   Chief Complaint  Patient presents with  . Fall  . Hip Pain  no issues. very pleasant, waiting for surgery later today. Daughter at bedside.  REVIEW OF SYSTEMS:  Review of Systems  Constitutional: Negative for fever, weight loss, malaise/fatigue and diaphoresis.  HENT: Negative for ear discharge, ear pain, hearing loss, nosebleeds, sore throat and tinnitus.   Eyes: Negative for blurred vision and pain.  Respiratory: Negative for cough, hemoptysis, shortness of breath and wheezing.   Cardiovascular: Negative for chest pain, palpitations, orthopnea and leg swelling.  Gastrointestinal: Negative for heartburn, nausea, vomiting, abdominal pain, diarrhea, constipation and blood in stool.  Genitourinary: Negative for dysuria, urgency and frequency.  Musculoskeletal: Positive for joint pain (LEFT HIP PAIN). Negative for myalgias and back pain.  Skin: Negative for itching and rash.  Neurological: Negative for dizziness, tingling, tremors, focal weakness, seizures, weakness and headaches.  Psychiatric/Behavioral: Negative for depression. The patient is not nervous/anxious.     DRUG ALLERGIES:  No Known Allergies VITALS:  Blood pressure 153/76, pulse 88, temperature 98.9 F (37.2 C), temperature source Oral, resp. rate 18, height 5\' 2"  (1.575 m), weight 49.669 kg (109 lb 8 oz), SpO2 95 %. PHYSICAL EXAMINATION:  Physical Exam  Constitutional: She is oriented to person, place, and time and well-developed, well-nourished, and in no distress.  HENT:  Head: Normocephalic and atraumatic.  Eyes: Conjunctivae and EOM are normal. Pupils are equal, round, and reactive to light.  Neck: Normal range of motion. Neck supple. No tracheal deviation present. No thyromegaly present.  Cardiovascular: Normal rate, regular rhythm and  normal heart sounds.   Pulmonary/Chest: Effort normal and breath sounds normal. No respiratory distress. She has no wheezes. She exhibits no tenderness.  Abdominal: Soft. Bowel sounds are normal. She exhibits no distension. There is no tenderness.  Musculoskeletal:       Left hip: She exhibits decreased range of motion, tenderness and bony tenderness.  Neurological: She is alert and oriented to person, place, and time. No cranial nerve deficit.  Skin: Skin is warm and dry. No rash noted.  Psychiatric: Mood and affect normal.   LABORATORY PANEL:   CBC  Recent Labs Lab 03/31/15 0432  WBC 7.4  HGB 9.2*  HCT 28.2*  PLT 156   ------------------------------------------------------------------------------------------------------------------ Chemistries   Recent Labs Lab 03/31/15 0432  NA 140  K 4.3  CL 108  CO2 28  GLUCOSE 119*  BUN 20  CREATININE 1.08*  CALCIUM 8.9   RADIOLOGY:  No results found. ASSESSMENT AND PLAN:  This is a very pleasant 81 year old female with a history of coronary artery disease/CABG, severe mitral and tricuspid regurgitation, ischemic cardiomyopathy with an EF of 35%, presents after mechanical fall and unfortunately has suffered a displaced left trochanteric fracture.  1. Preoperative clearance for left trochanteric fracture: Patient is moderate risk for moderate risk procedure. Patient should remain preoperatively and postoperatively. On her beta blocker. She should continue statin, Imdur, aspirin, as well. Patient may proceed to surgery without further cardiac intervention.  2. Coronary artery disease: continue on Coreg, lisinopril, aspirin, statin, indoor.  3. Essential hypertension: Patient's blood pressure is well controlled. Continue Coreg, Norvasc, lisinopril and Imdur.  4. Severe mitral and tricuspid regurgitation: Continue current cardiac meds and watch fluid status.  5. Ischemic cardiomyopathy, ejection fraction of 35%: Patient's ejection  fraction has asked actually improved since last EF of 25% on his current medications. I would continue her current cardiac medications. Please watch fluid status preoperatively and postoperatively.     All the records are reviewed and case discussed with Care Management/Social Worker. Management plans discussed with the patient, family and they are in agreement.  CODE STATUS: DO NOT RESUSCITATE  TOTAL TIME TAKING CARE OF THIS PATIENT: 35 minutes.   More than 50% of the time was spent in counseling/coordination of care: YES (discussed with daughter at bedside)  POSSIBLE D/C IN 3-4 DAYS, DEPENDING ON CLINICAL CONDITION.  Orthopedic evaluation   [facility], Darcey Cardy M.D on 03/31/2015 at 11:24 AM  Between 7am to 6pm - Pager - [phone]  After 6pm go to [email or web address] - password [email or web address] [facility]  [facility]  Office  [phone]  CC:  Primary care physician; No primary care provider on file.

## 2015-03-31 NOTE — Op Note (Signed)
03/30/2015 - 03/31/2015  2:50 PM  Patient:   Vickie Yang  Pre-Op Diagnosis:   Subtrochanteric/intertrochanteric fracture, left hip.  Post-Op Diagnosis:   Same.  Procedure:   Reduction and internal fixation of subtrochanteric/intertrochanteric fracture, left hip.  Surgeon:   Maryagnes Amos, MD  Assistant:   None  Anesthesia:   Spinal  Findings:   As above  Complications:   None  EBL:   50 cc  Fluids:   400 cc crystalloid  UOP:   75 cc  TT:   None  Drains:   None  Closure:   Staples  Implants:   Biomet Affyxis 11 x 380 mm TFN with an 85 mm lag screw and a 40 mm distal interlocking screw  Brief Clinical Note:   The patient is an 81 year old female who lives at home with her family. She apparently tripped and fell onto her left hip yesterday. She was brought to the emergency room where x-rays demonstrated above-noted fracture. The patient has been cleared medically and presents at this time for reduction and internal fixation of the left hip fracture.  Procedure:   The patient was brought into the operating room. After adequate spinal anesthesia was obtained, the patient was lain in the supine position on the fracture table. The uninjured leg was placed in a flexed and abducted position while the injured lower extremity was placed in longitudinal traction. The fracture was reduced using longitudinal traction and internal rotation. The adequacy of reduction was verified fluoroscopically in AP and lateral projections and found to be near anatomic. The lateral aspects of the left hip and thigh were prepped with ChloraPrep solution before being draped sterilely. Preoperative antibiotics were administered. The greater trochanter was identified fluoroscopically and an approximately 3 cm incision made about 2-3 fingerbreadths above the tip of the greater trochanter. The incision was carried down through the subcutaneous tissues to expose the gluteal fascia. This was split the length of  the incision, providing access to the tip of the trochanter. Under fluoroscopic guidance, a guidewire was drilled through the tip of the trochanter into the proximal metaphysis to the level of the lesser trochanter. After verifying its position fluoroscopically in AP and lateral projections, it was overreamed with the initial reamer to the depth of the lesser trochanter. A guidewire was passed down through the femoral canal to the supracondylar region. The adequacy of guidewire position was verified fluoroscopically in AP and lateral projections before the length of the guidewire within the canal was measured and found to be 380 mm. It was overreamed sequentially using the flexible reamers, beginning with an 11 mm reamer and progressing to a 12.5 mm reamer. This provided good cortical chatter. The 11 x 380 mm Biomet Affyxis TFN rod was selected and advanced to the appropriate depth, as verified fluoroscopically. The guide system for the lag screw was positioned and advanced through an approximately 2 cm stab incision over the lateral aspect of the proximal femur. The guidewire was drilled up through the trochanteric femoral nail and into the femoral neck to rest within 5 mm of subchondral bone. After verifying its position in the femoral neck and head in both AP and lateral projections, the guidewire was measured and found to be optimally replicated by an 85 mm lag screw. The guidewire was overreamed to the appropriate depth before the lag screw was inserted and advanced to the appropriate depth as verified fluoroscopically in AP and lateral projections. The locking screw was advanced, then backed off a  quarter turn to set the lag screw. Again the adequacy of hardware position and fracture reduction was verified fluoroscopically in AP and lateral projections and found to be excellent.  Attention was directed distally. Using the "perfect circle" technique, the leg and fluoroscopy machine were positioned  appropriately. An approximate 1.5 cm stab incision was made over the skin at the appropriate point before the drill bit was advanced through the cortex and across the static hole of the nail. The appropriate length of the screw was determined before the 40 mm distal interlocking screw was positioned, then advanced and tightened securely. Again the adequacy of screw position was verified fluoroscopically in AP and lateral projections and found to be excellent.  The wounds were irrigated thoroughly with sterile saline solution before the deeper subcutaneous tissues were closed using 2-0 Vicryl interrupted sutures. The skin was closed using staples. Sterile bulky dressings were applied to all wounds before the patient was transferred back to his/her hospital bed. The patient was then transferred to the recovery room in satisfactory condition after tolerating the procedure well.

## 2015-04-01 ENCOUNTER — Encounter: Payer: Self-pay | Admitting: Surgery

## 2015-04-01 LAB — BASIC METABOLIC PANEL
ANION GAP: 4 — AB (ref 5–15)
BUN: 25 mg/dL — AB (ref 6–20)
CALCIUM: 8.5 mg/dL — AB (ref 8.9–10.3)
CO2: 24 mmol/L (ref 22–32)
Chloride: 111 mmol/L (ref 101–111)
Creatinine, Ser: 1.23 mg/dL — ABNORMAL HIGH (ref 0.44–1.00)
GFR calc Af Amer: 44 mL/min — ABNORMAL LOW (ref >60)
GFR, EST NON AFRICAN AMERICAN: 38 mL/min — AB (ref >60)
GLUCOSE: 147 mg/dL — AB (ref 65–99)
Potassium: 4.5 mmol/L (ref 3.5–5.1)
SODIUM: 139 mmol/L (ref 135–145)

## 2015-04-01 LAB — CBC
HEMATOCRIT: 27.5 % — AB (ref 35.0–47.0)
HEMOGLOBIN: 9 g/dL — AB (ref 12.0–16.0)
MCH: 29.4 pg (ref 26.0–34.0)
MCHC: 32.6 g/dL (ref 32.0–36.0)
MCV: 90.3 fL (ref 80.0–100.0)
Platelets: 134 10*3/uL — ABNORMAL LOW (ref 150–440)
RBC: 3.05 MIL/uL — AB (ref 3.80–5.20)
RDW: 16.4 % — ABNORMAL HIGH (ref 11.5–14.5)
WBC: 7.5 10*3/uL (ref 3.6–11.0)

## 2015-04-01 MED ORDER — SODIUM CHLORIDE 0.9 % IV SOLN
INTRAVENOUS | Status: AC
  Administered 2015-04-01 – 2015-04-02 (×2): via INTRAVENOUS

## 2015-04-01 MED ORDER — ENOXAPARIN SODIUM 30 MG/0.3ML ~~LOC~~ SOLN
30.0000 mg | SUBCUTANEOUS | Status: DC
  Administered 2015-04-02 – 2015-04-03 (×2): 30 mg via SUBCUTANEOUS
  Filled 2015-04-01 (×2): qty 0.3

## 2015-04-01 NOTE — Care Management Important Message (Signed)
Important Message  Patient Details  Name: Vickie AdesMary J Yang MRN: [id number] Date of Birth: [birth year]   Medicare Important Message Given:  Yes    Olegario MessierKathy A Zavian Slowey 04/01/2015, 9:48 AM

## 2015-04-01 NOTE — Progress Notes (Signed)
  Subjective: 1 Day Post-Op Procedure(s) (LRB): INTRAMEDULLARY (IM) NAIL INTERTROCHANTRIC (Left) Patient reports pain as mild.   Patient is well, and has had no acute complaints or problems.  Pt is pleasantly demented. Plan is to go Skilled nursing facility after hospital stay. Negative for chest pain and shortness of breath Fever: no Gastrointestinal:Positive for nausea without any vomiting  Objective: Vital signs in last 24 hours: Temp:  [96.6 F (35.9 C)-100.1 F (37.8 C)] 100.1 F (37.8 C) (11/29 0817) Pulse Rate:  [64-97] 93 (11/29 0817) Resp:  [15-18] 18 (11/29 0817) BP: (90-150)/(52-80) 128/80 mmHg (11/29 0817) SpO2:  [91 %-100 %] 92 % (11/29 0817)  Intake/Output from previous day:  Intake/Output Summary (Last 24 hours) at 04/01/15 1015 Last data filed at 04/01/15 0800  Gross per 24 hour  Intake    760 ml  Output    795 ml  Net    -35 ml    Intake/Output this shift: Total I/O In: 120 [P.O.:120] Out: -   Labs:  Recent Labs  03/30/15 1210 03/31/15 0432 04/01/15 0512  HGB 10.1* 9.2* 9.0*    Recent Labs  03/31/15 0432 04/01/15 0512  WBC 7.4 7.5  RBC 3.15* 3.05*  HCT 28.2* 27.5*  PLT 156 134*    Recent Labs  03/31/15 0432 04/01/15 0512  NA 140 139  K 4.3 4.5  CL 108 111  CO2 28 24  BUN 20 25*  CREATININE 1.08* 1.23*  GLUCOSE 119* 147*  CALCIUM 8.9 8.5*    Recent Labs  03/30/15 1210  INR 1.04    EXAM General - Patient is Alert and able to answer simple questions. Extremity - ABD soft Sensation intact distally Intact pulses distally Dorsiflexion/Plantar flexion intact Incision: dressing C/D/I Dressing/Incision - clean, dry, no drainage Motor Function - intact, moving foot and toes well on exam.   Past Medical History  Diagnosis Date  . Coronary artery disease   . Hypertension     Assessment/Plan: 1 Day Post-Op Procedure(s) (LRB): INTRAMEDULLARY (IM) NAIL INTERTROCHANTRIC (Left) Active Problems:   Hip fracture  (HCC)  Estimated body mass index is 20.02 kg/(m^2) as calculated from the following:   Height as of this encounter: 5\' 2"  (1.575 m).   Weight as of this encounter: 49.669 kg (109 lb 8 oz). Advance diet Up with therapy   Pt has not had a BM, will begin working on this. Will change bulky dressing tomorrow to a honeycomb dressing.  DVT Prophylaxis - Lovenox, Foot Pumps and TED hose Weight-Bearing as tolerated to left leg  J. Horris LatinoLance McGhee, PA-C [facility] 04/01/2015, 10:15 AM

## 2015-04-01 NOTE — Care Management (Signed)
PT is recommending SNF. CSW aware.

## 2015-04-01 NOTE — Progress Notes (Signed)
Clinical Social Worker (CSW) met with patient and her daughter Vickie Yang was at bedside. CSW presented bed offers. Patient chose [facility]. Doug admissions coordinator at [facility] is aware of accepted offer. Plan is for patient to D/C to [facility] Thursday pending medical clearance. CSW will continue to follow and assist as needed.    Morgan, LCSWA [phone] 

## 2015-04-01 NOTE — Progress Notes (Signed)
[facility] [place] at [facility]   PATIENT NAME: Vickie Yang    MR#:  [id number]  DATE OF BIRTH:  [birth year]  SUBJECTIVE:  CHIEF COMPLAINT:   Chief Complaint  Patient presents with  . Fall  . Hip Pain  sitting in chair, husband at bedside. POD1 REVIEW OF SYSTEMS:  Review of Systems  Constitutional: Negative for fever, weight loss, malaise/fatigue and diaphoresis.  HENT: Negative for ear discharge, ear pain, hearing loss, nosebleeds, sore throat and tinnitus.   Eyes: Negative for blurred vision and pain.  Respiratory: Negative for cough, hemoptysis, shortness of breath and wheezing.   Cardiovascular: Negative for chest pain, palpitations, orthopnea and leg swelling.  Gastrointestinal: Negative for heartburn, nausea, vomiting, abdominal pain, diarrhea, constipation and blood in stool.  Genitourinary: Negative for dysuria, urgency and frequency.  Musculoskeletal: Positive for joint pain (LEFT HIP PAIN). Negative for myalgias and back pain.  Skin: Negative for itching and rash.  Neurological: Negative for dizziness, tingling, tremors, focal weakness, seizures, weakness and headaches.  Psychiatric/Behavioral: Negative for depression. The patient is not nervous/anxious.    DRUG ALLERGIES:  No Known Allergies VITALS:  Blood pressure 128/80, pulse 93, temperature 100.1 F (37.8 C), temperature source Oral, resp. rate 18, height  (1.575 m), weight 49.669 kg (109 lb 8 oz), SpO2 92 %. PHYSICAL EXAMINATION:  Physical Exam  Constitutional: She is oriented to person, place, and time and well-developed, well-nourished, and in no distress.  HENT:  Head: Normocephalic and atraumatic.  Eyes: Conjunctivae and EOM are normal. Pupils are equal, round, and reactive to light.  Neck: Normal range of motion. Neck supple. No tracheal deviation present. No thyromegaly present.  Cardiovascular: Normal rate, regular rhythm and normal heart sounds.   Pulmonary/Chest:  Effort normal and breath sounds normal. No respiratory distress. She has no wheezes. She exhibits no tenderness.  Abdominal: Soft. Bowel sounds are normal. She exhibits no distension. There is no tenderness.  Musculoskeletal:  Dressing/Incision - clean, dry, no drainage Motor Function - intact, moving foot and toes well on exam.   Neurological: She is alert and oriented to person, place, and time. No cranial nerve deficit.  Skin: Skin is warm and dry. No rash noted.  Psychiatric: Mood and affect normal.   LABORATORY PANEL:   CBC  Recent Labs Lab 04/01/15 0512  WBC 7.5  HGB 9.0*  HCT 27.5*  PLT 134*   ------------------------------------------------------------------------------------------------------------------ Chemistries   Recent Labs Lab 04/01/15 0512  NA 139  K 4.5  CL 111  CO2 24  GLUCOSE 147*  BUN 25*  CREATININE 1.23*  CALCIUM 8.5*   RADIOLOGY:  No results found. ASSESSMENT AND PLAN:  This is a very pleasant 81 year old female with a history of coronary artery disease/CABG, severe mitral and tricuspid regurgitation, ischemic cardiomyopathy with an EF of 35%, presents after mechanical fall and unfortunately has suffered a displaced left trochanteric fracture.  1. ARF: could be prerenal, gentle IV hydration and monitor kidney function. Avoid nephrotoxins.  2. Coronary artery disease: continue on Coreg, lisinopril, aspirin, statin, indoor.  3. Essential hypertension: Patient's blood pressure is well controlled. Continue Coreg, Norvasc, lisinopril and Imdur.  4. Severe mitral and tricuspid regurgitation: Continue current cardiac meds and watch fluid status.  5. Ischemic cardiomyopathy, ejection fraction of 35%: Patient's ejection fraction has asked actually improved since last EF of 25% on his current medications. continue her current cardiac medications.   6. Left trochanteric fracture: s/p Reduction and internal fixation of subtrochanteric/intertrochanteric  fracture, left  hip. POD 1. Further mgmt per ortho.   All the records are reviewed and case discussed with Care Management/Social Worker. Management plans discussed with the patient, family and they are in agreement.  CODE STATUS: DO NOT RESUSCITATE  TOTAL TIME TAKING CARE OF THIS PATIENT: 35 minutes.   More than 50% of the time was spent in counseling/coordination of care: YES (discussed with daughter at bedside)  POSSIBLE D/C IN 1-2 DAYS, DEPENDING ON CLINICAL CONDITION. Likely Thursday if find placement.   [facility], Pennelope Basque M.D on 04/01/2015 at 2:39 PM  Between 7am to 6pm - Pager - [phone]  After 6pm go to [email or web address] - password [email or web address] [facility]  [facility]  Office  [phone]  CC: Primary care physician; No primary care provider on file.

## 2015-04-01 NOTE — Progress Notes (Signed)
Spoke with Dr Elpidio AnisSudini regarding pt's inability to void. In and out cath x1 when 400 or > is in bladder

## 2015-04-01 NOTE — Progress Notes (Signed)
Physical Therapy Treatment Patient Details Name: Vickie Yang MRN: [id number] DOB: 07/03/1924 Today's Date: 04/01/2015    History of Present Illness Pt tripped and fell at home and was brought to the ED for L hip pain. She was found to have a L hip fracture and underwent reduction and internal fixation for a subtrochanteric fracture. PMH: CAD s/p CABG x3, ischemic CM, HTN, CKDIII, and hyperlipidemia. Pt is POD#1 at time of evaluation without reported post-op complications.Pt reports approximately 5 falls in the last 12 months.     PT Comments    Pt demonstrates decreased strength and poor weight acceptance to LLE. LLE buckling and pain in standing with very poor ambulation requiring maxA+2 to ensure safe return from recliner to bed. Pt able to complete most exercises but is unable to understand how to perform quad sets. Poor quad firing noted in LLE during PM session. Pt will benefit from skilled PT services to address deficits in strength, balance, and mobility in order to return to full function at home.   Follow Up Recommendations  SNF     Equipment Recommendations  None recommended by PT (TBD at SNF)    Recommendations for Other Services       Precautions / Restrictions Precautions Precautions: Fall Restrictions Weight Bearing Restrictions: No LLE Weight Bearing: Weight bearing as tolerated    Mobility  Bed Mobility Overal bed mobility: Needs Assistance Bed Mobility: Sit to Supine       Sit to supine: Max assist   General bed mobility comments: Pt requires cues and instruction as well as LLE assist returning to bed. MaxA+2 to scoot up toward [facility]  Transfers Overall transfer level: Needs assistance Equipment used: Rolling walker (2 wheeled) Transfers: Sit to/from Stand Sit to Stand: Max assist;+2 physical assistance         General transfer comment: Poor LLE strength with minimal weight shift into L side. Cues for safe hand placement, proper sequencing, and  appropriate use of walker. Pt still with very poor standing strength and poor safety awareness  Ambulation/Gait Ambulation/Gait assistance: Max assist;+2 physical assistance Ambulation Distance (Feet): 3 Feet Assistive device: Rolling walker (2 wheeled) Gait Pattern/deviations: Step-to pattern Gait velocity: Decreased Gait velocity interpretation: <1.8 ft/sec, indicative of risk for recurrent falls General Gait Details: Pt transfers from recliner to bed. Pt requires max cues for sequencing with walker and for stepping. Poor weight acceptance to LLE with buckling and assist required to advance leg.   Stairs            Wheelchair Mobility    Modified Rankin (Stroke Patients Only)       Balance Overall balance assessment: Needs assistance   Sitting balance-Leahy Scale: Fair       Standing balance-Leahy Scale: Poor                      Cognition Arousal/Alertness: Awake/alert Behavior During Therapy: WFL for tasks assessed/performed Overall Cognitive Status: Difficult to assess                      Exercises General Exercises - Lower Extremity Ankle Circles/Pumps: Strengthening;Both;10 reps;Supine Short Arc Quad: Strengthening;Left;10 reps;Supine Heel Slides: Strengthening;Left;10 reps;Supine Hip ABduction/ADduction: Strengthening;Left;10 reps;Supine Straight Leg Raises: Strengthening;Left;10 reps;Supine    General Comments        Pertinent Vitals/Pain Pain Assessment: Faces Faces Pain Scale: Hurts even more Pain Location: L hip, unable to rate. Reports "a little pain" Pain Intervention(s): Monitored during session  Home Living                      Prior Function            PT Goals (current goals can now be found in the care plan section) Acute Rehab PT Goals Patient Stated Goal: "I'm going to be able to walk again" PT Goal Formulation: With patient Time For Goal Achievement: 04/15/15 Potential to Achieve Goals:  Fair Progress towards PT goals: Progressing toward goals    Frequency  BID    PT Plan Current plan remains appropriate    Co-evaluation             End of Session Equipment Utilized During Treatment: Gait belt Activity Tolerance: Patient limited by pain Patient left: with call bell/phone within reach;with family/visitor present;with SCD's reapplied;in bed;with bed alarm set (pillow under LLE.)     Time: [id number] PT Time Calculation (min) (ACUTE ONLY): 17 min  Charges:  $Therapeutic Exercise: 8-22 mins                    G Codes:      Sharalyn InkJason D Rodrigus Kilker PT, DPT   Dondi Burandt 04/01/2015, 2:53 PM

## 2015-04-01 NOTE — Progress Notes (Signed)
Pt has not voided post foley removal. Bladder scan showed 268 cc. Prime Doc paged

## 2015-04-01 NOTE — Evaluation (Signed)
Physical Therapy Evaluation Patient Details Name: Vickie Yang MRN: [id number] DOB: [birth year] Today's Date: 04/01/2015   History of Present Illness  Pt tripped and fell at home and was brought to the ED for L hip pain. She was found to have a L hip fracture and underwent reduction and internal fixation for a subtrochanteric fracture. PMH: CAD s/p CABG x3, ischemic CM, HTN, CKDIII, and hyperlipidemia. Pt is POD#1 at time of evaluation without reported post-op complications.Pt reports approximately 5 falls in the last 12 months.   Clinical Impression  Pt demonstrates severe LLE weakness and pain with all mobility. She requires heavy assist with minimal weight acceptance to LLE during transfers and attempted ambulation. Pt is AOx2 at time of evaluation but demonstrates good command follow and effort. She will need SNF placement at discharge to safely return home. Pt will benefit from skilled PT services to address deficits in strength, balance, and mobility in order to return to full function at home. e    Follow Up Recommendations SNF    Equipment Recommendations  None recommended by PT (TBD at SNF)    Recommendations for Other Services       Precautions / Restrictions Precautions Precautions: Fall Restrictions Weight Bearing Restrictions: Yes LLE Weight Bearing: Weight bearing as tolerated      Mobility  Bed Mobility Overal bed mobility: Needs Assistance Bed Mobility: Supine to Sit     Supine to sit: Max assist     General bed mobility comments: Poor L hip abduction and flexion strength. Pt requires considerable cues and assist to come to sitting at EOB and scoot toward edge  Transfers Overall transfer level: Needs assistance Equipment used: Rolling walker (2 wheeled) Transfers: Sit to/from Stand Sit to Stand: Max assist;+2 physical assistance         General transfer comment: Poor LLE strength with minimal weight shift into L side. Cues for safe hand placement,  proper sequencing, and appropriate use of walker  Ambulation/Gait Ambulation/Gait assistance: Max assist;+2 physical assistance Ambulation Distance (Feet): 3 Feet Assistive device: Rolling walker (2 wheeled) Gait Pattern/deviations: Step-to pattern;Decreased stance time - left;Decreased weight shift to left;Antalgic;Decreased step length - right Gait velocity: Decreased Gait velocity interpretation: <1.8 ft/sec, indicative of risk for recurrent falls General Gait Details: Pt with very poor weight acceptance to LLE due to pain. She has difficulty advancing LLE and difficulty with proper walker sequencing. Strength and sequencing worsens throughout distance with quick transfer to chair required  Stairs            Wheelchair Mobility    Modified Rankin (Stroke Patients Only)       Balance Overall balance assessment: Needs assistance Sitting-balance support: No upper extremity supported Sitting balance-Leahy Scale: Fair       Standing balance-Leahy Scale: Poor                               Pertinent Vitals/Pain Pain Assessment: 0-10 Pain Score: 8  Pain Location: L hip Pain Intervention(s): Monitored during session;Premedicated before session    Home Living Family/patient expects to be discharged to:: Skilled nursing facility Living Arrangements: Other relatives Available Help at Discharge: Family Type of Home: House Home Access: Level entry     Home Layout: One level Home Equipment: Environmental consultantWalker - 2 wheels;Shower seat (no spc, no BSC)      Prior Function Level of Independence: Needs assistance   Gait / Transfers Assistance Needed: Reports she is  supposed to use walker but frequently does not use  ADL's / Homemaking Assistance Needed: Difficulty to obtain information from patient        Hand Dominance   Dominant Hand: Right    Extremity/Trunk Assessment   Upper Extremity Assessment: Generalized weakness (Functional but deconditioned)            Lower Extremity Assessment: LLE deficits/detail   LLE Deficits / Details: RLE strength is functional but deconditioned. Pt unable to perform L SLR without considerable assistance. Full DF/PF and pt reports intact sensation. Poor quad contraction on L side which improves throughout session. Initially requires considerable assist for SAQ but strength improves with repetitions     Communication   Communication: HOH  Cognition Arousal/Alertness: Awake/alert Behavior During Therapy: WFL for tasks assessed/performed Overall Cognitive Status: No family/caregiver present to determine baseline cognitive functioning (AOx2 at time of evaluation)                      General Comments      Exercises General Exercises - Lower Extremity Ankle Circles/Pumps: Strengthening;Both;10 reps;Supine Quad Sets: Strengthening;Left;10 reps;Supine Short Arc Quad: Strengthening;Left;10 reps;Supine Heel Slides: Strengthening;Left;10 reps;Supine Hip ABduction/ADduction: Strengthening;Left;10 reps;Supine Straight Leg Raises: Strengthening;Left;10 reps;Supine      Assessment/Plan    PT Assessment Patient needs continued PT services  PT Diagnosis Difficulty walking;Abnormality of gait;Generalized weakness;Acute pain   PT Problem List Decreased strength;Decreased activity tolerance;Decreased balance;Decreased mobility;Decreased cognition;Decreased knowledge of use of DME;Decreased safety awareness;Pain  PT Treatment Interventions DME instruction;Gait training;Functional mobility training;Therapeutic activities;Therapeutic exercise;Balance training;Neuromuscular re-education;Cognitive remediation;Patient/family education;Manual techniques   PT Goals (Current goals can be found in the Care Plan section) Acute Rehab PT Goals Patient Stated Goal: "I'm going to be able to walk again" PT Goal Formulation: With patient Time For Goal Achievement: 04/15/15 Potential to Achieve Goals: Fair    Frequency  BID   Barriers to discharge        Co-evaluation               End of Session Equipment Utilized During Treatment: Gait belt Activity Tolerance: Patient limited by pain Patient left: in chair;with call bell/phone within reach;with chair alarm set;with family/visitor present;with SCD's reapplied (pillow under LLE. RN notified that more ice needed for pack) Nurse Communication: Mobility status (need for ice)         Time: 1610-9604 PT Time Calculation (min) (ACUTE ONLY): 30 min   Charges:   PT Evaluation $Initial PT Evaluation Tier I: 1 Procedure PT Treatments $Therapeutic Exercise: 8-22 mins   PT G Codes:       Sharalyn Ink Huprich PT, DPT   Huprich,Jason 04/01/2015, 10:30 AM

## 2015-04-01 NOTE — Anesthesia Postprocedure Evaluation (Signed)
Anesthesia Post Note  Patient: Vickie AdesMary J Yang  Procedure(s) Performed: Procedure(s) (LRB): INTRAMEDULLARY (IM) NAIL INTERTROCHANTRIC (Left)  Patient location during evaluation: Nursing Unit Anesthesia Type: Spinal Level of consciousness: awake, awake and alert and patient cooperative Pain management: pain level controlled Vital Signs Assessment: post-procedure vital signs reviewed and stable Respiratory status: spontaneous breathing, nonlabored ventilation and respiratory function stable Cardiovascular status: blood pressure returned to baseline Postop Assessment: no headache and patient able to bend at knees Anesthetic complications: no    Last Vitals:  Filed Vitals:   03/31/15 2146 04/01/15 0410  BP: 114/56 115/73  Pulse: 97 96  Temp: 37.1 C 37.7 C  Resp: 18 18    Last Pain:  Filed Vitals:   04/01/15 0418  PainSc: 2       LLE Sensation: Other (Comment) (decreased sensation) RLE Motor Response: Other (Comment) (spinal still effective) RLE Sensation: Other (Comment) (decreased sensation)      Darrol JumpMarion,  Kaitlynd Phillips

## 2015-04-01 NOTE — Progress Notes (Signed)
Anticoagulation monitoring  81 yo female ordered enoxaparin 40 mg SQ q24h for DVT ppx.  Scr 1.23, CrCl ~ 25.3 ml/min, BMI 20.1 Based on CrCl<30 ml/min, will transition pt to enoxaparin 30 mg SQ q24h.  Pharmacy will continue to monitor.   Vickie Yang, PharmD, BCPS Clinical Pharmacist 04/01/2015 9:20 AM

## 2015-04-02 LAB — CBC
HCT: 22.9 % — ABNORMAL LOW (ref 35.0–47.0)
HEMOGLOBIN: 7.5 g/dL — AB (ref 12.0–16.0)
MCH: 29.6 pg (ref 26.0–34.0)
MCHC: 32.8 g/dL (ref 32.0–36.0)
MCV: 90 fL (ref 80.0–100.0)
PLATELETS: 115 10*3/uL — AB (ref 150–440)
RBC: 2.55 MIL/uL — AB (ref 3.80–5.20)
RDW: 15.8 % — ABNORMAL HIGH (ref 11.5–14.5)
WBC: 7.7 10*3/uL (ref 3.6–11.0)

## 2015-04-02 LAB — BASIC METABOLIC PANEL
ANION GAP: 3 — AB (ref 5–15)
BUN: 26 mg/dL — ABNORMAL HIGH (ref 6–20)
CHLORIDE: 111 mmol/L (ref 101–111)
CO2: 24 mmol/L (ref 22–32)
Calcium: 8.4 mg/dL — ABNORMAL LOW (ref 8.9–10.3)
Creatinine, Ser: 1.22 mg/dL — ABNORMAL HIGH (ref 0.44–1.00)
GFR calc non Af Amer: 39 mL/min — ABNORMAL LOW (ref >60)
GFR, EST AFRICAN AMERICAN: 45 mL/min — AB (ref >60)
Glucose, Bld: 122 mg/dL — ABNORMAL HIGH (ref 65–99)
Potassium: 4.7 mmol/L (ref 3.5–5.1)
Sodium: 138 mmol/L (ref 135–145)

## 2015-04-02 MED ORDER — OXYCODONE HCL 5 MG PO TABS: 5.0000 mg | ORAL_TABLET | Freq: Four times a day (QID) | ORAL | Status: DC | PRN

## 2015-04-02 MED ORDER — SODIUM CHLORIDE 0.9 % IV SOLN
INTRAVENOUS | Status: DC
  Administered 2015-04-02 – 2015-04-03 (×2): via INTRAVENOUS

## 2015-04-02 MED ORDER — ENOXAPARIN SODIUM 40 MG/0.4ML ~~LOC~~ SOLN: 40.0000 mg | SUBCUTANEOUS | Status: DC

## 2015-04-02 MED ORDER — SODIUM CHLORIDE 0.9 % IV BOLUS (SEPSIS)
1000.0000 mL | Freq: Once | INTRAVENOUS | Status: AC
  Administered 2015-04-02: 1000 mL via INTRAVENOUS

## 2015-04-02 NOTE — Progress Notes (Signed)
Physical Therapy Treatment Patient Details Name: Vickie Yang MRN: [id number] DOB: 1924-10-13 Today's Date: 04/02/2015    History of Present Illness Pt tripped and fell at home and was brought to the ED for L hip pain. She was found to have a L hip fracture and underwent reduction and internal fixation for a subtrochanteric fracture. PMH: CAD s/p CABG x3, ischemic CM, HTN, CKDIII, and hyperlipidemia. Pt is POD#1 at time of evaluation without reported post-op complications.Pt reports approximately 5 falls in the last 12 months.     PT Comments    Pt continues to struggle to accept weight onto LLE during attempts at ambulation. She demonstrates less knee buckling on this session than previous session, but remains unable to bear enough weight through her LLE to attempt ambulation. Initially her BP was rather low, though with sitting she demonstrates BP over 100 systolic. She demonstrates improving LE strength in there-ex, though she continues to have pain with any mobility/ther-ex. Patient will continue to benefit from skilled PT services to address her mobility deficits.   Follow Up Recommendations  SNF     Equipment Recommendations  None recommended by PT    Recommendations for Other Services       Precautions / Restrictions Precautions Precautions: Fall Restrictions Weight Bearing Restrictions: No LLE Weight Bearing: Weight bearing as tolerated    Mobility  Bed Mobility Overal bed mobility: Needs Assistance Bed Mobility: Sit to Supine     Supine to sit: Max assist;+2 for physical assistance     General bed mobility comments: Pt appears somewhat confused and requires significant assistance for trunk and LE management to come to the edge of the bed.   Transfers Overall transfer level: Needs assistance Equipment used: Rolling walker (2 wheeled) Transfers: Sit to/from Stand Sit to Stand: Max assist;+2 physical assistance         General transfer comment: Pt  demonstrates poor hand placement and several episodes of her knee buckling throughout transfer.   Ambulation/Gait Ambulation/Gait assistance: Max assist;+2 physical assistance Ambulation Distance (Feet): 3 Feet Assistive device: Rolling walker (2 wheeled) Gait Pattern/deviations: Step-to pattern;Antalgic Gait velocity: Decreased   General Gait Details: Pt requires significant cuing and assistance to pivot her feet, she is unable to take any steps as she cannot accept her body weight on LLE. She is able to pivot, though she keeps her weight mostly on her RLE.    Stairs            Wheelchair Mobility    Modified Rankin (Stroke Patients Only)       Balance Overall balance assessment: Needs assistance Sitting-balance support: No upper extremity supported Sitting balance-Leahy Scale: Fair Sitting balance - Comments: No loss of balance or leaning identified.    Standing balance support: Bilateral upper extremity supported Standing balance-Leahy Scale: Poor Standing balance comment: Patient continues to have mini-knee buckling episodes, even with significant support from PT.                     Cognition Arousal/Alertness: Awake/alert Behavior During Therapy: WFL for tasks assessed/performed Overall Cognitive Status: History of cognitive impairments - at baseline (She is either hard of hearing or somewhat confused, it is difficult to tell as she intermittently responds correctly. )                      Exercises General Exercises - Lower Extremity Heel Slides: AROM;AAROM;Both;10 reps Straight Leg Raises: AROM;AAROM;5 reps;Both Hip Flexion/Marching: AROM;Seated;Both;10 reps  General Comments        Pertinent Vitals/Pain Pain Assessment: Faces Faces Pain Scale: Hurts even more Pain Location: L hip, though she reports just a little pain Pain Intervention(s): Limited activity within patient's tolerance;Monitored during session;Ice applied;Repositioned     Home Living                      Prior Function            PT Goals (current goals can now be found in the care plan section) Acute Rehab PT Goals Patient Stated Goal: "I'm going to be able to walk again" PT Goal Formulation: With patient Time For Goal Achievement: 04/15/15 Potential to Achieve Goals: Fair Progress towards PT goals: Progressing toward goals    Frequency  BID    PT Plan Current plan remains appropriate    Co-evaluation             End of Session Equipment Utilized During Treatment: Gait belt Activity Tolerance: Patient limited by pain;Patient tolerated treatment well Patient left: with call bell/phone within reach;with family/visitor present;with SCD's reapplied;in bed;with chair alarm set     Time: [id number] PT Time Calculation (min) (ACUTE ONLY): 24 min  Charges:  $Gait Training: 8-22 mins $Therapeutic Exercise: 8-22 mins                    G Codes:      Kerin RansomPatrick A McNamara, PT, DPT    04/02/2015, 3:15 PM

## 2015-04-02 NOTE — Progress Notes (Signed)
Pt bladder scanned with 360 ml. MD notified.Orders received. Will continue to monitor.

## 2015-04-02 NOTE — Progress Notes (Signed)
Patient has not urinated from 7a to 3p, bladder scan 237, Dr. Sherryll BurgerShah called no new orders but to watch. Patient complains of no discomfort or urge to urinate.

## 2015-04-02 NOTE — Progress Notes (Addendum)
.    Subjective: 2 Days Post-Op Procedure(s) (LRB): INTRAMEDULLARY (IM) NAIL INTERTROCHANTRIC (Left) Patient reports pain as mild.   Patient is well, and has had no acute complaints or problems.  Pt is pleasantly demented. Plan is to go Skilled nursing facility after hospital stay. Negative for chest pain and shortness of breath Fever: no Gastrointestinal:negative for nausea without any vomiting  Objective: Vital signs in last 24 hours: Temp:  [98.3 F (36.8 C)-100.1 F (37.8 C)] 98.4 F (36.9 C) (11/30 0454) Pulse Rate:  [82-98] 82 (11/30 0454) Resp:  [16-19] 19 (11/30 0454) BP: (117-129)/(54-80) 117/54 mmHg (11/30 0454) SpO2:  [92 %-94 %] 93 % (11/30 0454)  Intake/Output from previous day:  Intake/Output Summary (Last 24 hours) at 04/02/15 0607 Last data filed at 04/02/15 0300  Gross per 24 hour  Intake    360 ml  Output    350 ml  Net     10 ml    Intake/Output this shift: Total I/O In: -  Out: 350 [Urine:350]  Labs:  Recent Labs  03/30/15 1210 03/31/15 0432 04/01/15 0512  HGB 10.1* 9.2* 9.0*    Recent Labs  03/31/15 0432 04/01/15 0512  WBC 7.4 7.5  RBC 3.15* 3.05*  HCT 28.2* 27.5*  PLT 156 134*    Recent Labs  03/31/15 0432 04/01/15 0512  NA 140 139  K 4.3 4.5  CL 108 111  CO2 28 24  BUN 20 25*  CREATININE 1.08* 1.23*  GLUCOSE 119* 147*  CALCIUM 8.9 8.5*    Recent Labs  03/30/15 1210  INR 1.04    EXAM General - Patient is Alert and able to answer simple questions. Extremity - ABD soft Sensation intact distally Intact pulses distally Dorsiflexion/Plantar flexion intact Incision: dressing C/D/I Dressing/Incision - clean, dry, no drainage. New dressing applied. Motor Function - intact, moving foot and toes well on exam. The patient ambulated 3 feet with physical therapy.  Past Medical History  Diagnosis Date  . Coronary artery disease   . Hypertension     Assessment/Plan: 2 Days Post-Op Procedure(s) (LRB): INTRAMEDULLARY  (IM) NAIL INTERTROCHANTRIC (Left) Active Problems:   Hip fracture (HCC)  Estimated body mass index is 20.02 kg/(m^2) as calculated from the following:   Height as of this encounter: 5\' 2"  (1.575 m).   Weight as of this encounter: 49.669 kg (109 lb 8 oz). Advance diet Up with therapy   Pt has not had a BM, will begin working on this. Possible discharge to rehabilitation if cleared medically. Discharge instructions and prescriptions written.  Addendum: Hg 7.5 this AM, will let internal medicine decide on potential blood transfusion. Pt denies any dizziness or increased weakness this AM. Dressing changed, no drainage noted.  DVT Prophylaxis - Lovenox, Foot Pumps and TED hose Weight-Bearing as tolerated to left leg  J. Horris LatinoLance Lewis Grivas, PA-C [facility] 04/02/2015, 6:07 AM

## 2015-04-02 NOTE — Discharge Instructions (Signed)
INSTRUCTIONS AFTER Surgery  o Remove items at home which could result in a fall. This includes throw rugs or furniture in walking pathways o ICE to the affected joint every three hours while awake for 30 minutes at a time, for at least the first 3-5 days, and then as needed for pain and swelling.  Continue to use ice for pain and swelling. You may notice swelling that will progress down to the foot and ankle.  This is normal after surgery.  Elevate your leg when you are not up walking on it.   o Continue to use the breathing machine you got in the hospital (incentive spirometer) which will help keep your temperature down.  It is common for your temperature to cycle up and down following surgery, especially at night when you are not up moving around and exerting yourself.  The breathing machine keeps your lungs expanded and your temperature down.   DIET:  As you were doing prior to hospitalization, we recommend a well-balanced diet.  DRESSING / WOUND CARE / SHOWERING  The dressing is not waterproof. Please change the bandage and dressing every 3-4 days. Evaluate the wound for any type of opening or drainage. Staples will come out in 2 weeks at the orthopedic department at clinic.  ACTIVITY  o Increase activity slowly as tolerated, but follow the weight bearing instructions below.   o No driving for 6 weeks or until further direction given by your physician.  You cannot drive while taking narcotics.  o No lifting or carrying greater than 10 lbs. until further directed by your surgeon. o Avoid periods of inactivity such as sitting longer than an hour when not asleep. This helps prevent blood clots.  o You may return to work once you are authorized by your doctor.     WEIGHT BEARING  Weightbearing as tolerated.   EXERCISES Ambulate with physical therapy working on balance and gait training. Lower extremity strengthening and range of motion.  CONSTIPATION  Constipation is defined medically  as fewer than three stools per week and severe constipation as less than one stool per week.  Even if you have a regular bowel pattern at home, your normal regimen is likely to be disrupted due to multiple reasons following surgery.  Combination of anesthesia, postoperative narcotics, change in appetite and fluid intake all can affect your bowels.   YOU MUST use at least one of the following options; they are listed in order of increasing strength to get the job done.  They are all available over the counter, and you may need to use some, POSSIBLY even all of these options:    Drink plenty of fluids (prune juice may be helpful) and high fiber foods Colace 100 mg by mouth twice a day  Senokot for constipation as directed and as needed Dulcolax (bisacodyl), take with full glass of water  Miralax (polyethylene glycol) once or twice a day as needed.  If you have tried all these things and are unable to have a bowel movement in the first 3-4 days after surgery call either your surgeon or your primary doctor.    If you experience loose stools or diarrhea, hold the medications until you stool forms back up.  If your symptoms do not get better within 1 week or if they get worse, check with your doctor.  If you experience "the worst abdominal pain ever" or develop nausea or vomiting, please contact the office immediately for further recommendations for treatment.   ITCHING:  If you experience itching with your medications, try taking only a single pain pill, or even half a pain pill at a time.  You can also use Benadryl over the counter for itching or also to help with sleep.   TED HOSE STOCKINGS:  Use stockings on both legs until for at least 2 weeks or as directed by physician office. They may be removed at night for sleeping.  MEDICATIONS:  See your medication summary on the After Visit Summary that nursing will review with you.  You may have some home medications which will be placed on hold until you  complete the course of blood thinner medication.  It is important for you to complete the blood thinner medication as prescribed.  PRECAUTIONS:  If you experience chest pain or shortness of breath - call 911 immediately for transfer to the hospital emergency department.   If you develop a fever greater that 101 F, purulent drainage from wound, increased redness or drainage from wound, foul odor from the wound/dressing, or calf pain - CONTACT YOUR SURGEON.                                                   FOLLOW-UP APPOINTMENTS:  If you do not already have a post-op appointment, please call the office for an appointment to be seen by your surgeon.  Guidelines for how soon to be seen are listed in your After Visit Summary, but are typically between 1-4 weeks after surgery.  OTHER INSTRUCTIONS:     MAKE SURE YOU:   Understand these instructions.   Get help right away if you are not doing well or get worse.    Thank you for letting us be a part of your medical care team.  It is a privilege we respect greatly.  We hope these instructions will help you stay on track for a fast and full recovery!

## 2015-04-02 NOTE — Progress Notes (Signed)
Patient bladder scanned 388. In and out cathed per MD order. 350 cc out.Patient tolerated procedure well.

## 2015-04-02 NOTE — Progress Notes (Signed)
[facility] - [place] at [facility]   PATIENT NAME: Vickie Yang    MR#:  [id number]  DATE OF BIRTH:  [birth year]  SUBJECTIVE:  CHIEF COMPLAINT:   Chief Complaint  Patient presents with  . Fall  . Hip Pain  had some trouble voiding and required in & out cath once. POD2 REVIEW OF SYSTEMS:  Review of Systems  Constitutional: Negative for fever, weight loss, malaise/fatigue and diaphoresis.  HENT: Negative for ear discharge, ear pain, hearing loss, nosebleeds, sore throat and tinnitus.   Eyes: Negative for blurred vision and pain.  Respiratory: Negative for cough, hemoptysis, shortness of breath and wheezing.   Cardiovascular: Negative for chest pain, palpitations, orthopnea and leg swelling.  Gastrointestinal: Negative for heartburn, nausea, vomiting, abdominal pain, diarrhea, constipation and blood in stool.  Genitourinary: Negative for dysuria, urgency and frequency.  Musculoskeletal: Positive for joint pain (LEFT HIP PAIN). Negative for myalgias and back pain.  Skin: Negative for itching and rash.  Neurological: Negative for dizziness, tingling, tremors, focal weakness, seizures, weakness and headaches.  Psychiatric/Behavioral: Negative for depression. The patient is not nervous/anxious.    DRUG ALLERGIES:  No Known Allergies VITALS:  Blood pressure 122/60, pulse 78, temperature 99.1 F (37.3 C), temperature source Oral, resp. rate 18, height  (1.575 m), weight 49.669 kg (109 lb 8 oz), SpO2 95 %. PHYSICAL EXAMINATION:  Physical Exam  Constitutional: She is oriented to person, place, and time and well-developed, well-nourished, and in no distress.  HENT:  Head: Normocephalic and atraumatic.  Eyes: Conjunctivae and EOM are normal. Pupils are equal, round, and reactive to light.  Neck: Normal range of motion. Neck supple. No tracheal deviation present. No thyromegaly present.  Cardiovascular: Normal rate, regular rhythm and normal heart sounds.    Pulmonary/Chest: Effort normal and breath sounds normal. No respiratory distress. She has no wheezes. She exhibits no tenderness.  Abdominal: Soft. Bowel sounds are normal. She exhibits no distension. There is no tenderness.  Musculoskeletal:  Dressing/Incision - clean, dry, no drainage Motor Function - intact, moving foot and toes well on exam.   Neurological: She is alert and oriented to person, place, and time. No cranial nerve deficit.  Skin: Skin is warm and dry. No rash noted.  Psychiatric: Mood and affect normal.   LABORATORY PANEL:   CBC  Recent Labs Lab 04/02/15 0512  WBC 7.7  HGB 7.5*  HCT 22.9*  PLT 115*   ------------------------------------------------------------------------------------------------------------------ Chemistries   Recent Labs Lab 04/02/15 0512  NA 138  K 4.7  CL 111  CO2 24  GLUCOSE 122*  BUN 26*  CREATININE 1.22*  CALCIUM 8.4*   RADIOLOGY:  No results found. ASSESSMENT AND PLAN:  This is a very pleasant 81 year old female with a history of coronary artery disease/CABG, severe mitral and tricuspid regurgitation, ischemic cardiomyopathy with an EF of 35%, presents after mechanical fall and unfortunately has suffered a displaced left trochanteric fracture.  1. ARF: could be prerenal, gentle IV hydration and monitor kidney function. Avoid nephrotoxins.  2. Coronary artery disease: continue on Coreg, lisinopril, aspirin, statin, indoor.  3. Essential hypertension: Patient's blood pressure is well controlled. Continue Coreg, Norvasc, lisinopril and Imdur.  4. Severe mitral and tricuspid regurgitation: Continue current cardiac meds and watch fluid status.  5. Ischemic cardiomyopathy, ejection fraction of 35%: Patient's ejection fraction has asked actually improved since last EF of 25% on his current medications. continue her current cardiac medications.   6. Left trochanteric fracture: s/p Reduction and internal fixation  of  subtrochanteric/intertrochanteric fracture, left hip. POD 2. Further mgmt per ortho. Stop dilaudid and cut back on oxycodone also.  7. Difficulty urination: continue in & out cath and avoid foley if can.    All the records are reviewed and case discussed with Care Management/Social Worker. Management plans discussed with the patient, family and they are in agreement.  CODE STATUS: DO NOT RESUSCITATE  TOTAL TIME TAKING CARE OF THIS PATIENT: 35 minutes.   More than 50% of the time was spent in counseling/coordination of care: YES (discussed with daughter at bedside)  POSSIBLE D/C IN 1-2 DAYS, DEPENDING ON CLINICAL CONDITION. Likely Thursday if find placement.   [facility], Ettamae Barkett M.D on 04/02/2015 at 8:17 AM  Between 7am to 6pm - Pager - [phone]  After 6pm go to [email or web address] - password [email or web address] [facility]  [facility]  Office  [phone]  CC: Primary care physician; No primary care provider on file.

## 2015-04-02 NOTE — Progress Notes (Signed)
Physical Therapy Treatment Patient Details Name: Vickie Yang MRN: [id number] DOB: 07-07-1924 Today's Date: 04/02/2015    History of Present Illness Pt tripped and fell at home and was brought to the ED for L hip pain. She was found to have a L hip fracture and underwent reduction and internal fixation for a subtrochanteric fracture. PMH: CAD s/p CABG x3, ischemic CM, HTN, CKDIII, and hyperlipidemia. Pt is POD#1 at time of evaluation without reported post-op complications.Pt reports approximately 5 falls in the last 12 months.     PT Comments    Patient continues to be very limited in mobility secondary to pain in LLE. She demonstrates improved weight bearing and attempt at mobility in this session, though she leans posteriorly and is very unsafe with all mobility. She also demonstrates increased confusion with some of her responses, which were not related to the questions asked. Patient demonstrates improved quad strength in this session with minimal LLE buckling during ambulation. Skilled PT services continue to be indicated to address the above deficits.   Follow Up Recommendations  SNF     Equipment Recommendations  None recommended by PT ([facility]    Recommendations for Other Services       Precautions / Restrictions Precautions Precautions: Fall Restrictions Weight Bearing Restrictions: No LLE Weight Bearing: Weight bearing as tolerated    Mobility  Bed Mobility Overal bed mobility: Needs Assistance Bed Mobility: Sit to Supine     Supine to sit: Max assist;+2 for physical assistance Sit to supine: Max assist;Mod assist;+2 for physical assistance   General bed mobility comments: Pt provides increased assistance with her UEs and LEs in lifting her LEs over the edge to return to supine.   Transfers Overall transfer level: Needs assistance Equipment used: Rolling walker (2 wheeled) Transfers: Sit to/from Stand Sit to Stand: Mod assist;Max assist;+2  physical assistance         General transfer comment: Pt demonstrates need for cuing for hand placement and sequencing for transfer. She demonstrates less knee buckling on this transfer,   Ambulation/Gait Ambulation/Gait assistance: Mod assist;Max assist;+2 physical assistance Ambulation Distance (Feet): 3 Feet Assistive device: Rolling walker (2 wheeled) Gait Pattern/deviations: Step-to pattern;Decreased step length - left;Decreased step length - right;Trunk flexed;Antalgic Gait velocity: Decreased   General Gait Details: Patient continues to struggle to bear weight through LLE, though during this session she is able to slide her LLE backwards during bed to chair transfer. When she attempts to flex her RLE to bring it posteriorly her LLE begins to buckle secondary to weakness.    Stairs            Wheelchair Mobility    Modified Rankin (Stroke Patients Only)       Balance Overall balance assessment: Needs assistance Sitting-balance support: No upper extremity supported Sitting balance-Leahy Scale: Fair Sitting balance - Comments: No loss of balance or leaning identified.    Standing balance support: Bilateral upper extremity supported Standing balance-Leahy Scale: Poor Standing balance comment: Pt continues to lean posteriorly heavily and requires significant assistance for all mobility.                     Cognition Arousal/Alertness: Awake/alert Behavior During Therapy: WFL for tasks assessed/performed Overall Cognitive Status: History of cognitive impairments - at baseline (She does seem more confused during this session, providing tangential responses to questions.)  Exercises General Exercises - Lower Extremity Short Arc Quad: Strengthening;Left;10 reps;Supine Long Arc Quad: AROM;AAROM;Both;10 reps Heel Slides: AROM;AAROM;Both;10 reps Hip ABduction/ADduction: AROM;AAROM;Both;10 reps Straight Leg Raises: AROM;AAROM;Both;10  reps Hip Flexion/Marching: AROM;Seated;Both;10 reps;AAROM    General Comments        Pertinent Vitals/Pain Pain Assessment: Faces Faces Pain Scale: Hurts little more Pain Location: L hip, she moans initially with movement, but provides increased effort with attempts at [facility] through LLE.  Pain Descriptors / Indicators: Aching Pain Intervention(s): Limited activity within patient's tolerance;Monitored during session;Ice applied;Repositioned    Home Living                      Prior Function            PT Goals (current goals can now be found in the care plan section) Acute Rehab PT Goals Patient Stated Goal: "I'm going to be able to walk again" PT Goal Formulation: With patient Time For Goal Achievement: 04/15/15 Potential to Achieve Goals: Fair Progress towards PT goals: Progressing toward goals    Frequency  BID    PT Plan Current plan remains appropriate    Co-evaluation             End of Session Equipment Utilized During Treatment: Gait belt Activity Tolerance: Patient limited by pain;Patient tolerated treatment well Patient left: in bed;with call bell/phone within reach;with bed alarm set     Time: 1520-1538 PT Time Calculation (min) (ACUTE ONLY): 18 min  Charges:  $Gait Training: 8-22 mins $Therapeutic Exercise: 8-22 mins                    G Codes:      Kerin Ransom, PT, DPT    04/02/2015, 4:58 PM

## 2015-04-02 NOTE — H&P (Signed)
Per Dr. Sherryll BurgerShah, able to hold coreg for low BP

## 2015-04-02 NOTE — Progress Notes (Signed)
Plan is for [facility]. Clinical Social Worker (CSW) will continue to follow and assist as needed.   Jetta LoutBailey Morgan, LCSWA [phone]

## 2015-04-03 ENCOUNTER — Other Ambulatory Visit: Payer: Self-pay | Admitting: Internal Medicine

## 2015-04-03 LAB — CBC
HCT: 24.8 % — ABNORMAL LOW (ref 35.0–47.0)
Hemoglobin: 7.9 g/dL — ABNORMAL LOW (ref 12.0–16.0)
MCH: 28.9 pg (ref 26.0–34.0)
MCHC: 32 g/dL (ref 32.0–36.0)
MCV: 90.4 fL (ref 80.0–100.0)
PLATELETS: 147 10*3/uL — AB (ref 150–440)
RBC: 2.74 MIL/uL — ABNORMAL LOW (ref 3.80–5.20)
RDW: 16.2 % — AB (ref 11.5–14.5)
WBC: 6.9 10*3/uL (ref 3.6–11.0)

## 2015-04-03 LAB — BASIC METABOLIC PANEL
Anion gap: 2 — ABNORMAL LOW (ref 5–15)
BUN: 26 mg/dL — AB (ref 6–20)
CO2: 23 mmol/L (ref 22–32)
CREATININE: 1.21 mg/dL — AB (ref 0.44–1.00)
Calcium: 8.5 mg/dL — ABNORMAL LOW (ref 8.9–10.3)
Chloride: 112 mmol/L — ABNORMAL HIGH (ref 101–111)
GFR calc Af Amer: 45 mL/min — ABNORMAL LOW (ref >60)
GFR, EST NON AFRICAN AMERICAN: 39 mL/min — AB (ref >60)
Glucose, Bld: 112 mg/dL — ABNORMAL HIGH (ref 65–99)
Potassium: 4.7 mmol/L (ref 3.5–5.1)
SODIUM: 137 mmol/L (ref 135–145)

## 2015-04-03 LAB — PREPARE RBC (CROSSMATCH)

## 2015-04-03 MED ORDER — SODIUM CHLORIDE 0.9 % IV SOLN: Freq: Once | INTRAVENOUS | Status: DC

## 2015-04-03 NOTE — Progress Notes (Signed)
Spoke with Vickie Yang, [employer] rep at [phone], to notify of non-emergent EMS transport.  Auth notification reference given as [id number].   Service date range good from 04/03/15 - 07/02/15.   Gap exception requested to determine if services can be considered at an in-network level.

## 2015-04-03 NOTE — Progress Notes (Signed)
.    Subjective: 3 Days Post-Op Procedure(s) (LRB): INTRAMEDULLARY (IM) NAIL INTERTROCHANTRIC (Left) Patient reports pain as mild.   Patient is well, and has had no acute complaints or problems.  Pt is pleasantly demented. Plan is to go Skilled nursing facility after hospital stay. Negative for chest pain and shortness of breath Fever: no Gastrointestinal:negative for nausea without any vomiting  Objective: Vital signs in last 24 hours: Temp:  [98 F (36.7 C)-99.4 F (37.4 C)] 99.2 F (37.3 C) (12/01 0328) Pulse Rate:  [73-85] 81 (12/01 0328) Resp:  [16-18] 18 (12/01 0328) BP: (96-155)/(56-119) 155/66 mmHg (12/01 0328) SpO2:  [90 %-100 %] 90 % (12/01 0328)  Intake/Output from previous day:  Intake/Output Summary (Last 24 hours) at 04/03/15 0615 Last data filed at 04/03/15 0510  Gross per 24 hour  Intake 2109.58 ml  Output    150 ml  Net 1959.58 ml    Intake/Output this shift: Total I/O In: 1989.6 [I.V.:1039.6; Other:950] Out: 150 [Urine:150]  Labs:  Recent Labs  04/01/15 0512 04/02/15 0512 04/03/15 0506  HGB 9.0* 7.5* 7.9*    Recent Labs  04/02/15 0512 04/03/15 0506  WBC 7.7 6.9  RBC 2.55* 2.74*  HCT 22.9* 24.8*  PLT 115* 147*    Recent Labs  04/01/15 0512 04/02/15 0512  NA 139 138  K 4.5 4.7  CL 111 111  CO2 24 24  BUN 25* 26*  CREATININE 1.23* 1.22*  GLUCOSE 147* 122*  CALCIUM 8.5* 8.4*   No results for input(s): LABPT, INR in the last 72 hours.  EXAM General - Patient is Alert and able to answer simple questions. Extremity - ABD soft Sensation intact distally Intact pulses distally Dorsiflexion/Plantar flexion intact Incision: dressing C/D/I Dressing/Incision - clean, dry, no drainage.  Motor Function - intact, moving foot and toes well on exam. The patient ambulated 3 feet with physical therapy.  Past Medical History  Diagnosis Date  . Coronary artery disease   . Hypertension     Assessment/Plan: 3 Days Post-Op Procedure(s)  (LRB): INTRAMEDULLARY (IM) NAIL INTERTROCHANTRIC (Left) Active Problems:   Hip fracture (HCC)  Estimated body mass index is 20.02 kg/(m^2) as calculated from the following:   Height as of this encounter: 5\' 2"  (1.575 m).   Weight as of this encounter: 49.669 kg (109 lb 8 oz). Advance diet Up with therapy   Pt has not had a BM, will begin working on this. Possible discharge to rehabilitation if cleared medically. Discharge instructions and prescriptions written.  Addendum: Hg 7.9 this AM, the patient continues to have low output. One unit of transfused blood will be given before discharge.    DVT Prophylaxis - Lovenox, Foot Pumps and TED hose Weight-Bearing as tolerated to left leg  J. Horris LatinoLance McGhee, PA-C [facility] 04/03/2015, 6:15 AM

## 2015-04-03 NOTE — Clinical Social Work Placement (Signed)
   CLINICAL SOCIAL WORK PLACEMENT  NOTE  Date:  04/03/2015  Patient Details  Name: Vickie Yang MRN: [id number] Date of Birth: 05/11/1924  Clinical Social Work is seeking post-discharge placement for this patient at the Skilled  Nursing Facility level of care (*CSW will initial, date and re-position this form in  chart as items are completed):  Yes   Patient/family provided with [place] Clinical Social Work Department's list of facilities offering this level of care within the geographic area requested by the patient (or if unable, by the patient's family).  Yes   Patient/family informed of their freedom to choose among providers that offer the needed level of care, that participate in Medicare, Medicaid or managed care program needed by the patient, have an available bed and are willing to accept the patient.  Yes   Patient/family informed of [facility]'s ownership interest in [facility] and [facility], as well as of the fact that they are under no obligation to receive care at these facilities.  PASRR submitted to EDS on       PASRR number received on       Existing PASRR number confirmed on 03/31/15     FL2 transmitted to all facilities in geographic area requested by pt/family on 03/31/15     FL2 transmitted to all facilities within larger geographic area on       Patient informed that his/her managed care company has contracts with or will negotiate with certain facilities, including the following:        Yes   Patient/family informed of bed offers received.  Patient chooses bed at  [facility] )     Physician recommends and patient chooses bed at      Patient to be transferred to  [facility] ) on 04/03/15.  Patient to be transferred to facility by  [facility] )     Patient family notified on 04/03/15 of transfer.  Name of family member notified:   (Daughter [name] is aware of D/C today. )     PHYSICIAN       Additional  Comment:    _______________________________________________ Haig ProphetMorgan, Keontae Levingston G, LCSW 04/03/2015, 10:54 AM

## 2015-04-03 NOTE — Progress Notes (Signed)
Physical Therapy Treatment Patient Details Name: Vickie Yang MRN: [id number] DOB: 05/04/1924 Today's Date: 04/03/2015    History of Present Illness Pt tripped and fell at home and was brought to the ED for L hip pain. She was found to have a L hip fracture and underwent reduction and internal fixation for a subtrochanteric fracture. PMH: CAD s/p CABG x3, ischemic CM, HTN, CKDIII, and hyperlipidemia. Pt is POD#1 at time of evaluation without reported post-op complications.Pt reports approximately 5 falls in the last 12 months.     PT Comments    Pt performed bed exercises with therapist on this date. Upon arrival pt found to be sitting in stool. CNA comes and cleans patient. EMS has been contacted and is on the way to transport so OOB mobility deferred at this time. Pt completes all bed exercises as instructed but reports increase in LE pain. Pt ready for transfer to SNF. Pt will benefit from skilled PT services to address deficits in strength, balance, and mobility in order to return to full function at home.    Follow Up Recommendations  SNF     Equipment Recommendations  None recommended by PT (TBD at SNF)    Recommendations for Other Services       Precautions / Restrictions Precautions Precautions: Fall Restrictions Weight Bearing Restrictions: No LLE Weight Bearing: Weight bearing as tolerated    Mobility  Bed Mobility               General bed mobility comments: Deferred at this time. EMS on the way to transport patient  Transfers                    Ambulation/Gait                 Stairs            Wheelchair Mobility    Modified Rankin (Stroke Patients Only)       Balance                                    Cognition Arousal/Alertness: Awake/alert Behavior During Therapy: WFL for tasks assessed/performed Overall Cognitive Status: History of cognitive impairments - at baseline                       Exercises General Exercises - Lower Extremity Ankle Circles/Pumps: Strengthening;Both;10 reps;Supine (2 sets) Quad Sets: Strengthening;Left;10 reps;Supine (2 sets) Short Arc Quad: Strengthening;10 reps;Supine;Both (2 sets) Heel Slides: Strengthening;Both;10 reps;Supine (2 sets) Hip ABduction/ADduction: Strengthening;Both;10 reps;Supine (2 sets) Straight Leg Raises: Strengthening;Both;10 reps;Supine (2 sets) Hip Flexion/Marching: Strengthening;Both;10 reps;Supine (2 sets)    General Comments        Pertinent Vitals/Pain Pain Assessment: 0-10 Pain Score: 0-No pain Pain Location: L hip, reports increase in pain to 9/10 with bed exercises Pain Descriptors / Indicators: Aching Pain Intervention(s): Monitored during session;Patient requesting pain meds-RN notified    Home Living                      Prior Function            PT Goals (current goals can now be found in the care plan section) Acute Rehab PT Goals Patient Stated Goal: "I'm going to be able to walk again" PT Goal Formulation: With patient Time For Goal Achievement: 04/15/15 Potential to Achieve Goals: Fair Progress towards PT goals: Progressing  toward goals    Frequency  BID    PT Plan Current plan remains appropriate    Co-evaluation             End of Session   Activity Tolerance: Patient tolerated treatment well Patient left: in bed;with call bell/phone within reach;with bed alarm set     Time: 1110-1125 PT Time Calculation (min) (ACUTE ONLY): 15 min  Charges:  $Therapeutic Exercise: 8-22 mins                    G Codes:      Sharalyn InkJason D Huprich PT, DPT   Huprich,Jason 04/03/2015, 11:34 AM

## 2015-04-03 NOTE — Progress Notes (Signed)
[facility] called to [facility]. EMS called, pt waiting on transportation.

## 2015-04-03 NOTE — Progress Notes (Signed)
Patient is medically stable for D/C to [facility] today. [facility]. RN will call report to 500 hall RN and arrange EMS for transport. Clinical Child psychotherapistocial Worker (CSW) sent D/C Summary, D/C packet and FL2 to [facility] via [facility]. DNR, prescription and EMS form on chart. Patient is aware of above. CSW contacted patient's daughter [name] is aware of above. Please reconsult if future social work needs arise. CSW signing off.   Jetta LoutBailey Morgan, LCSWA [phone]

## 2015-04-03 NOTE — Progress Notes (Signed)
Per MD Sherryll BurgerShah pt will discharge with foley. Will continue to monitor.

## 2015-04-03 NOTE — [place] FL2 (Signed)
[place] MEDICAID FL2 LEVEL OF CARE SCREENING TOOL     IDENTIFICATION  Patient Vickie Yang Birthdate: 08-25-1924 Sex: female Admission Date (Current Location): 03/30/2015  [place] and [place] Number:  [facility] )   Facility and Address:  [facility], [address], [place], [place] [address]      Provider Number: [id number]  Attending Physician Name and Address:  Delfino Lovett, MD  Relative Name and Phone Number:       Current Level of Care: Hospital Recommended Level of Care: Skilled Nursing Facility Prior Approval Number:    Date Approved/Denied:   PASRR Number:  [id number])  Discharge Plan: SNF    Current Diagnoses: Patient Active Problem List   Diagnosis Date Noted  . Hip fracture (HCC) 03/30/2015    Orientation ACTIVITIES/SOCIAL BLADDER RESPIRATION    Self  Active Continent, Indwelling catheter Normal  BEHAVIORAL SYMPTOMS/MOOD NEUROLOGICAL BOWEL NUTRITION STATUS   (none )  (none ) Continent Diet (NPO for surgery )  PHYSICIAN VISITS COMMUNICATION OF NEEDS Height & Weight Skin  30 days Verbally  (157.5 cm) 109 lbs. Surgical wounds (Incision: Left Hip. )          AMBULATORY STATUS RESPIRATION    Assist extensive Normal      Personal Care Assistance Level of Assistance  Bathing, Feeding, Dressing Bathing Assistance: Limited assistance Feeding assistance: Independent Dressing Assistance: Limited assistance      Functional Limitations Info  Sight, Hearing, Speech Sight Info: Adequate Hearing Info: Adequate Speech Info: Adequate       SPECIAL CARE FACTORS FREQUENCY  PT (By licensed PT), OT (By licensed OT)     PT Frequency:  (5) OT Frequency:  (5)           Additional Factors Info  Code Status, Psychotropic Code Status Info:  (DNR )   Psychotropic Info:  (Zoloft )         Current Medications (04/03/2015):  This is the current hospital active medication list Current  Facility-Administered Medications  Medication Dose Route Frequency Provider Last Rate Last Dose  . 0.9 %  sodium chloride infusion   Intravenous Continuous Katharina Caper, MD 125 mL/hr at 04/03/15 0428    . 0.9 %  sodium chloride infusion   Intravenous Once Dedra Skeens, PA-C      . acetaminophen (TYLENOL) tablet 650 mg  650 mg Oral Q6H PRN Christena Flake, MD   650 mg at 04/02/15 2048   Or  . acetaminophen (TYLENOL) suppository 650 mg  650 mg Rectal Q6H PRN Christena Flake, MD      . alum & mag hydroxide-simeth (MAALOX/MYLANTA) 200-200-20 MG/5ML suspension 30 mL  30 mL Oral Q6H PRN Adrian Saran, MD      . amLODipine (NORVASC) tablet 5 mg  5 mg Oral Daily Christena Flake, MD   5 mg at 04/03/15 5621  . aspirin EC tablet 81 mg  81 mg Oral Daily Christena Flake, MD   81 mg at 04/03/15 3086  . atorvastatin (LIPITOR) tablet 20 mg  20 mg Oral Daily Christena Flake, MD   20 mg at 04/03/15 0809  . bisacodyl (DULCOLAX) suppository 10 mg  10 mg Rectal Daily PRN Christena Flake, MD      . busPIRone (BUSPAR) tablet 5 mg  5 mg Oral Daily Christena Flake, MD   5 mg at 04/03/15 5784  . carvedilol (COREG) tablet 3.125 mg  3.125 mg Oral BID WC Excell Seltzer  Poggi, MD   3.125 mg at 04/03/15 0808  . dextrose 5 % and 0.9 % NaCl with KCl 20 mEq/L infusion   Intravenous Continuous Christena FlakeJohn J Poggi, MD 75 mL/hr at 04/01/15 0709    . diphenhydrAMINE (BENADRYL) 12.5 MG/5ML elixir 12.5-25 mg  12.5-25 mg Oral Q4H PRN Christena FlakeJohn J Poggi, MD      . docusate sodium (COLACE) capsule 100 mg  100 mg Oral BID Christena FlakeJohn J Poggi, MD   100 mg at 04/03/15 [id number]  . enoxaparin (LOVENOX) injection 30 mg  30 mg Subcutaneous Q24H Christena FlakeJohn J Poggi, MD   30 mg at 04/03/15 0810  . furosemide (LASIX) tablet 40 mg  40 mg Oral Once Adrian SaranSital Mody, MD   40 mg at 03/30/15 1552  . isosorbide mononitrate (IMDUR) 24 hr tablet 30 mg  30 mg Oral Daily Adrian SaranSital Mody, MD   30 mg at 04/03/15 0808  . lisinopril (PRINIVIL,ZESTRIL) tablet 10 mg  10 mg Oral Daily Adrian SaranSital Mody, MD   10 mg at 04/03/15 0809  . magnesium  hydroxide (MILK OF MAGNESIA) suspension 30 mL  30 mL Oral Daily PRN Christena FlakeJohn J Poggi, MD   30 mL at 04/02/15 0817  . metoCLOPramide (REGLAN) tablet 5-10 mg  5-10 mg Oral Q8H PRN Christena FlakeJohn J Poggi, MD       Or  . metoCLOPramide (REGLAN) injection 5-10 mg  5-10 mg Intravenous Q8H PRN Christena FlakeJohn J Poggi, MD      . ondansetron [facility](ZOFRAN) tablet 4 mg  4 mg Oral Q6H PRN Christena FlakeJohn J Poggi, MD   4 mg at 04/02/15 [id number]   Or  . ondansetron (ZOFRAN) injection 4 mg  4 mg Intravenous Q6H PRN Christena FlakeJohn J Poggi, MD      . oxyCODONE (Oxy IR/ROXICODONE) immediate release tablet 5 mg  5 mg Oral Q6H PRN Delfino LovettVipul Shah, MD      . pantoprazole (PROTONIX) EC tablet 40 mg  40 mg Oral BID Christena FlakeJohn J Poggi, MD   40 mg at 04/03/15 [id number]  . senna-docusate (Senokot-S) tablet 1 tablet  1 tablet Oral QHS PRN Adrian SaranSital Mody, MD      . sertraline (ZOLOFT) tablet 25 mg  25 mg Oral Daily Christena FlakeJohn J Poggi, MD   25 mg at 04/03/15 0811  . sodium phosphate (FLEET) 7-19 GM/118ML enema 1 enema  1 enema Rectal Once PRN Christena FlakeJohn J Poggi, MD      . vitamin B-12 (CYANOCOBALAMIN) tablet 500 mcg  500 mcg Oral Daily Christena FlakeJohn J Poggi, MD   500 mcg at 04/03/15 [id number]     Discharge Medications: Please see discharge summary for a list of discharge medications.  Relevant Imaging Results:  Relevant Lab Results:  Recent Labs    Additional Information  (SSN: [id number])  Haig ProphetMorgan, Tangia Pinard G, LCSW

## 2015-04-03 NOTE — Care Management Important Message (Signed)
Important Message  Patient Details  Name: Vickie Yang MRN: [id number] Date of Birth: Oct 29, 1924   Medicare Important Message Given:  Yes    Olegario MessierKathy A Shiane Wenberg 04/03/2015, 9:47 AM

## 2015-04-03 NOTE — Discharge Summary (Signed)
[facility] [place] at [facility]   PATIENT NAME: Vickie FearMary Yang    MR#:  [id number]  DATE OF BIRTH:  1925/01/31  DATE OF ADMISSION:  03/30/2015 ADMITTING PHYSICIAN: Adrian SaranSital Mody, MD  DATE OF DISCHARGE: 04/03/2015  PRIMARY CARE PHYSICIAN: Barbette ReichmannHande, Vishwanath MD   ADMISSION DIAGNOSIS:  Weakness [R53.1] Closed left hip fracture, initial encounter (HCC) [S72.002A] DISCHARGE DIAGNOSIS:  Active Problems:   Hip fracture (HCC) Urinary retention  SECONDARY DIAGNOSIS:   Past Medical History  Diagnosis Date  . Coronary artery disease   . Hypertension    HOSPITAL COURSE:  This is a very pleasant 81 year old female with a history of coronary artery disease/CABG, severe mitral and tricuspid regurgitation, ischemic cardiomyopathy with an EF of 35%, admitted after mechanical fall and displaced left trochanteric fracture. Please see dr Camillia HerterMody's dictated H & P for further details. Ortho c/s was obtained with Dr Joice LoftsPoggi who did Reduction and internal fixation of subtrochanteric/intertrochanteric fracture, left hip. She is slowly improving and with POD 3. She worked with PT and recommended STR/SNF. Patient had mild ARF which was thought to be prerenal and improved with hydration.   Patient was noted to have difficulty urination and required foley and recommend voiding trial while at the facility. She and her family are agreeable with DC plans. DISCHARGE CONDITIONS:   stable  CONSULTS OBTAINED:  Treatment Team:  Christena FlakeJohn J Poggi, MD  DRUG ALLERGIES:  No Known Allergies  DISCHARGE MEDICATIONS:   Current Discharge Medication List    START taking these medications   Details  enoxaparin (LOVENOX) 40 MG/0.4ML injection Inject 0.4 mLs (40 mg total) into the skin daily. Qty: 14 Syringe, Refills: 0    oxyCODONE (OXY IR/ROXICODONE) 5 MG immediate release tablet Take 1 tablet (5 mg total) by mouth every 6 (six) hours as needed for breakthrough pain. Qty: 30 tablet, Refills: 0       CONTINUE these medications which have NOT CHANGED   Details  amLODipine (NORVASC) 10 MG tablet Take 5 mg by mouth daily. Refills: 0    aspirin EC 81 MG tablet Take 81 mg by mouth daily.    atorvastatin (LIPITOR) 20 MG tablet Take 20 mg by mouth daily. Refills: 1    busPIRone (BUSPAR) 5 MG tablet Take 5 mg by mouth daily. Refills: 0    carvedilol (COREG) 3.125 MG tablet Take 3.125 mg by mouth 2 (two) times daily with a meal. Refills: 1    ibuprofen (ADVIL,MOTRIN) 200 MG tablet Take 200 mg by mouth every 6 (six) hours as needed. For pain.    isosorbide dinitrate (ISORDIL) 30 MG tablet Take 30 mg by mouth daily. Refills: 0    omeprazole (PRILOSEC) 20 MG capsule Take 20 mg by mouth 2 (two) times daily. Refills: 0    sertraline (ZOLOFT) 25 MG tablet Take 25 mg by mouth daily. Refills: 0    vitamin B-12 (CYANOCOBALAMIN) 500 MCG tablet Take 500 mcg by mouth daily. Refills: 0         DISCHARGE INSTRUCTIONS:    DIET:  Regular diet  DISCHARGE CONDITION:  Good  ACTIVITY:  Activity as tolerated  OXYGEN:  Home Oxygen: No.   Oxygen Delivery: room air  DISCHARGE LOCATION:  nursing home   If you experience worsening of your admission symptoms, develop shortness of breath, life threatening emergency, suicidal or homicidal thoughts you must seek medical attention immediately by calling 911 or calling your MD immediately  if symptoms less severe.  You Must read complete  instructions/literature along with all the possible adverse reactions/side effects for all the Medicines you take and that have been prescribed to you. Take any new Medicines after you have completely understood and accpet all the possible adverse reactions/side effects.   Please note  You were cared for by a hospitalist during your hospital stay. If you have any questions about your discharge medications or the care you received while you were in the hospital after you are discharged, you can call the  unit and asked to speak with the hospitalist on call if the hospitalist that took care of you is not available. Once you are discharged, your primary care physician will handle any further medical issues. Please note that NO REFILLS for any discharge medications will be authorized once you are discharged, as it is imperative that you return to your primary care physician (or establish a relationship with a primary care physician if you do not have one) for your aftercare needs so that they can reassess your need for medications and monitor your lab values.    On the day of Discharge:  VITAL SIGNS:  Blood pressure 143/68, pulse 83, temperature 99 F (37.2 C), temperature source Oral, resp. rate 16, height  (1.575 m), weight 49.669 kg (109 lb 8 oz), SpO2 98 %. PHYSICAL EXAMINATION:  GENERAL:  81 y.o.-year-old patient lying in the bed with no acute distress.  EYES: Pupils equal, round, reactive to light and accommodation. No scleral icterus. Extraocular muscles intact.  HEENT: Head atraumatic, normocephalic. Oropharynx and nasopharynx clear.  NECK:  Supple, no jugular venous distention. No thyroid enlargement, no tenderness.  LUNGS: Normal breath sounds bilaterally, no wheezing, rales,rhonchi or crepitation. No use of accessory muscles of respiration.  CARDIOVASCULAR: S1, S2 normal. No murmurs, rubs, or gallops.  ABDOMEN: Soft, non-tender, non-distended. Bowel sounds present. No organomegaly or mass.  EXTREMITIES: No pedal edema, cyanosis, or clubbing.  NEUROLOGIC: Cranial nerves II through XII are intact. Muscle strength 5/5 in all extremities. Sensation intact. Gait not checked.  PSYCHIATRIC: The patient is alert and oriented x 3.  SKIN: No obvious rash, lesion, or ulcer.  DATA REVIEW:   CBC  Recent Labs Lab 04/03/15 0506  WBC 6.9  HGB 7.9*  HCT 24.8*  PLT 147*    Chemistries   Recent Labs Lab 04/02/15 0512  NA 138  K 4.7  CL 111  CO2 24  GLUCOSE 122*  BUN 26*   CREATININE 1.22*  CALCIUM 8.4*   Management plans discussed with the patient, family and they are in agreement.  CODE STATUS: DNR  TOTAL TIME TAKING CARE OF THIS PATIENT: 55 minutes.    [facility], Marchetta Navratil M.D on 04/03/2015 at 8:13 AM  Between 7am to 6pm - Pager - [phone]  After 6pm go to [email or web address] - password [email or web address] [facility]  [place] [facility]  Office  [phone]  CC: Primary care physician; Barbette Reichmann MD Christena Flake, MD  Note: This dictation was prepared with Dragon dictation along with smaller phrase technology. Any transcriptional errors that result from this process are unintentional.

## 2015-04-04 DIAGNOSIS — S72143A Displaced intertrochanteric fracture of unspecified femur, initial encounter for closed fracture: Secondary | ICD-10-CM

## 2015-04-04 HISTORY — DX: Displaced intertrochanteric fracture of unspecified femur, initial encounter for closed fracture: S72.143A

## 2015-04-06 LAB — TYPE AND SCREEN
ABO/RH(D): O POS
ANTIBODY SCREEN: NEGATIVE
UNIT DIVISION: 0

## 2015-04-07 ENCOUNTER — Encounter: Payer: Self-pay | Admitting: *Deleted

## 2015-04-07 DIAGNOSIS — R7989 Other specified abnormal findings of blood chemistry: Secondary | ICD-10-CM | POA: Insufficient documentation

## 2015-04-07 DIAGNOSIS — E782 Mixed hyperlipidemia: Secondary | ICD-10-CM | POA: Insufficient documentation

## 2015-04-07 DIAGNOSIS — D649 Anemia, unspecified: Secondary | ICD-10-CM

## 2015-04-07 HISTORY — DX: Anemia, unspecified: D64.9

## 2015-04-07 HISTORY — DX: Other specified abnormal findings of blood chemistry: R79.89

## 2015-04-07 HISTORY — DX: Mixed hyperlipidemia: E78.2

## 2015-04-14 ENCOUNTER — Encounter: Payer: Self-pay | Admitting: Obstetrics and Gynecology

## 2015-04-14 ENCOUNTER — Ambulatory Visit (INDEPENDENT_AMBULATORY_CARE_PROVIDER_SITE_OTHER): Payer: Medicare Other | Admitting: Obstetrics and Gynecology

## 2015-04-14 VITALS — BP 105/66 | HR 92 | Resp 16 | Ht 63.0 in

## 2015-04-14 DIAGNOSIS — R339 Retention of urine, unspecified: Secondary | ICD-10-CM

## 2015-04-14 NOTE — Progress Notes (Signed)
Catheter Removal  Patient is present today for a catheter removal.  10 ml of water was drained from the balloon. A 16FR foley cath was removed from the bladder no complications were noted . Patient tolerated well.  Preformed by: K.Russell,CMA  Follow up/ Additional notes: 3 weeks PVR

## 2015-04-14 NOTE — Progress Notes (Signed)
04/14/2015 3:12 PM   Vickie Yang 1925/02/19 [id number]  Referring provider: No referring provider defined for this encounter.  Chief Complaint  Patient presents with  . Urinary Retention  . Establish Care    HPI: Patient is a 81 year old pleasantly demented female presenting today for follow-up after recent admission for hip fracture. Patient is unable to provide any personal medical history today. When asked about her urinary catheter she states that she did not recall that she had one in place.  Per her hospital discharge notes patient was to follow up with [place] for a voiding trial after Foley catheter was placed for urinary retention following surgical repair of her hip fracture.  She currently resides at the skilled nursing facility [facility] rehabilitation. Patient denies any pain. She reports that she feels quite well.   PMH: Past Medical History  Diagnosis Date  . Coronary artery disease   . Hypertension   . Hip fracture (HCC) 03/30/2015  . Absolute anemia 04/07/2015  . Edema leg 01/20/2015  . Bimalleolar ankle fracture 12/06/2012  . Chronic systolic heart failure (HCC) 05/20/2014    Overview:  Global ef 25%   . Chronic kidney disease (CKD), stage III (moderate) 01/20/2015  . Closed intertrochanteric fracture of femur (HCC) 04/04/2015  . Closed fracture of part of fibula with tibia 12/22/2012  . Arteriosclerosis of coronary artery 12/19/2012    Overview:  Sp cabg 2002 with lima to lad svg om1 and rca with stent to lad 2004   . Clinical depression 12/19/2012  . Fracture of tibial plafond 12/13/2012  . Acid reflux 12/19/2012  . Left leg pain 01/20/2015  . Low serum vitamin D 04/07/2015  . Combined fat and carbohydrate induced hyperlipemia 04/07/2015  . Open ankle wound 02/28/2013  . Ankle pain 12/06/2012  . MI (mitral incompetence) 01/20/2015  . TI (tricuspid incompetence) 01/20/2015  . Breath shortness 10/03/2013    Surgical History: Past Surgical History  Procedure Laterality  Date  . Cardiac surgery    . Coronary artery bypass graft    . Intramedullary (im) nail intertrochanteric Left 03/31/2015    Procedure: INTRAMEDULLARY (IM) NAIL INTERTROCHANTRIC;  Surgeon: Christena Flake, MD;  Location: [facility] ORS;  Service: Orthopedics;  Laterality: Left;    Home Medications:    Medication List       This list is accurate as of: 04/14/15  3:12 PM.  Always use your most recent med list.               amLODipine 10 MG tablet  Commonly known as:  NORVASC  Take 5 mg by mouth daily.     aspirin EC 81 MG tablet  Take 81 mg by mouth daily.     atorvastatin 20 MG tablet  Commonly known as:  LIPITOR  Take 20 mg by mouth daily.     busPIRone 5 MG tablet  Commonly known as:  BUSPAR  Take 5 mg by mouth daily.     carvedilol 3.125 MG tablet  Commonly known as:  COREG  Take 3.125 mg by mouth 2 (two) times daily with a meal.     enoxaparin 40 MG/0.4ML injection  Commonly known as:  LOVENOX  Inject 0.4 mLs (40 mg total) into the skin daily.     ibuprofen 200 MG tablet  Commonly known as:  ADVIL,MOTRIN  Take 200 mg by mouth every 6 (six) hours as needed. For pain.     isosorbide dinitrate 30 MG tablet  Commonly known as:  ISORDIL  Take 30 mg by mouth daily.     nitroGLYCERIN 2.5 MG CR capsule  Take by mouth.     omeprazole 20 MG capsule  Commonly known as:  PRILOSEC  Take 20 mg by mouth 2 (two) times daily.     oxyCODONE 5 MG immediate release tablet  Commonly known as:  Oxy IR/ROXICODONE  Take 1 tablet (5 mg total) by mouth every 6 (six) hours as needed for breakthrough pain.     sertraline 25 MG tablet  Commonly known as:  ZOLOFT  Take 25 mg by mouth daily.     vitamin B-12 500 MCG tablet  Commonly known as:  CYANOCOBALAMIN  Take 500 mcg by mouth daily.        Allergies: No Known Allergies  Family History: Family History  Problem Relation Age of Onset  . Family history unknown: Yes    Social History:  reports that she has quit smoking. She  does not have any smokeless tobacco history on file. She reports that she does not drink alcohol or use illicit drugs.  ROS: UROLOGY Frequent Urination?: No Hard to postpone urination?: No Burning/pain with urination?: No Get up at night to urinate?: No Leakage of urine?: No Urine stream starts and stops?: No Trouble starting stream?: No Do you have to strain to urinate?: No Blood in urine?: No Urinary tract infection?: No Sexually transmitted disease?: No Injury to kidneys or bladder?: No Painful intercourse?: No Weak stream?: No Currently pregnant?: No Vaginal bleeding?: No Last menstrual period?: n/a  Gastrointestinal Nausea?: No Vomiting?: No Indigestion/heartburn?: No Diarrhea?: No Constipation?: No  Constitutional Fever: No Night sweats?: No Weight loss?: No Fatigue?: No  Skin Skin rash/lesions?: No Itching?: No  Eyes Blurred vision?: No Double vision?: No  Ears/Nose/Throat Sore throat?: No Sinus problems?: No  Hematologic/Lymphatic Swollen glands?: No Easy bruising?: No  Cardiovascular Leg swelling?: No Chest pain?: No  Respiratory Cough?: No Shortness of breath?: No  Endocrine Excessive thirst?: No  Musculoskeletal Back pain?: No Joint pain?: No  Neurological Headaches?: No Dizziness?: No  Psychologic Depression?: No Anxiety?: No  Physical Exam: BP 105/66 mmHg  Pulse 92  Resp 16  Ht 5\' 3"  (1.6 m)  Wt   Constitutional:  Alert and oriented, No acute distress, wheelchair bound HEENT: Melbeta AT, moist mucus membranes.  Trachea midline, no masses. Cardiovascular: No clubbing, cyanosis, or edema. Respiratory: Normal respiratory effort, no increased work of breathing. GU: No CVA tenderness.  Skin: No rashes, bruises or suspicious lesions. Neurologic: Grossly intact, no focal deficits, moving all 4 extremities. Psychiatric: Normal mood and affect.  Laboratory Data:   Urinalysis  Pertinent Imaging:   Assessment & Plan:     1. Urinary retention- Foley catheter was removed today for voiding trial. Orders written for facility to perform routine PVR checks and monitor urinary output. PVRs are greater than 200mL orders were written for patient to be intermittently catheterized to empty her bladder 3-4 times daily. She will follow-up with us in 3 weeks for recheck and PVR. I requested that her daughter be notified of her future appointments so she may accompany her.  There are no diagnoses linked to this encounter.  Return in about 3 weeks (around 05/05/2015).  These notes generated with voice recognition software. I apologize for typographical errors.  Earlie LouLindsay Kelley Knoth, FNP  [facility] [address], [address] [place], [place] [address] [phone]

## 2015-05-07 ENCOUNTER — Encounter: Payer: Self-pay | Admitting: Obstetrics and Gynecology

## 2015-05-07 ENCOUNTER — Ambulatory Visit: Payer: Medicare Other | Admitting: Obstetrics and Gynecology

## 2015-07-01 ENCOUNTER — Encounter: Payer: Self-pay | Admitting: Medical Oncology

## 2015-07-01 ENCOUNTER — Emergency Department: Payer: Medicare Other

## 2015-07-01 ENCOUNTER — Emergency Department
Admission: EM | Admit: 2015-07-01 | Discharge: 2015-07-01 | Disposition: A | Discharge status: Home / Self Care | Payer: Medicare Other | Attending: Emergency Medicine | Admitting: Emergency Medicine

## 2015-07-01 DIAGNOSIS — I129 Hypertensive chronic kidney disease with stage 1 through stage 4 chronic kidney disease, or unspecified chronic kidney disease: Secondary | ICD-10-CM | POA: Insufficient documentation

## 2015-07-01 DIAGNOSIS — G8929 Other chronic pain: Secondary | ICD-10-CM | POA: Diagnosis not present

## 2015-07-01 DIAGNOSIS — N183 Chronic kidney disease, stage 3 (moderate): Secondary | ICD-10-CM | POA: Diagnosis not present

## 2015-07-01 DIAGNOSIS — Z7901 Long term (current) use of anticoagulants: Secondary | ICD-10-CM | POA: Diagnosis not present

## 2015-07-01 DIAGNOSIS — Z79899 Other long term (current) drug therapy: Secondary | ICD-10-CM | POA: Insufficient documentation

## 2015-07-01 DIAGNOSIS — M79605 Pain in left leg: Secondary | ICD-10-CM | POA: Insufficient documentation

## 2015-07-01 DIAGNOSIS — Z87891 Personal history of nicotine dependence: Secondary | ICD-10-CM | POA: Diagnosis not present

## 2015-07-01 DIAGNOSIS — M79602 Pain in left arm: Secondary | ICD-10-CM

## 2015-07-01 DIAGNOSIS — Z7982 Long term (current) use of aspirin: Secondary | ICD-10-CM | POA: Insufficient documentation

## 2015-07-01 DIAGNOSIS — L97329 Non-pressure chronic ulcer of left ankle with unspecified severity: Secondary | ICD-10-CM | POA: Insufficient documentation

## 2015-07-01 LAB — BASIC METABOLIC PANEL
Anion gap: 7 (ref 5–15)
BUN: 21 mg/dL — AB (ref 6–20)
CO2: 25 mmol/L (ref 22–32)
CREATININE: 0.97 mg/dL (ref 0.44–1.00)
Calcium: 9.6 mg/dL (ref 8.9–10.3)
Chloride: 108 mmol/L (ref 101–111)
GFR calc Af Amer: 59 mL/min — ABNORMAL LOW (ref >60)
GFR, EST NON AFRICAN AMERICAN: 51 mL/min — AB (ref >60)
GLUCOSE: 111 mg/dL — AB (ref 65–99)
Potassium: 4.2 mmol/L (ref 3.5–5.1)
SODIUM: 140 mmol/L (ref 135–145)

## 2015-07-01 LAB — CBC WITH DIFFERENTIAL/PLATELET
BASOS ABS: 0.1 10*3/uL (ref 0–0.1)
BASOS PCT: 1 %
EOS PCT: 0 %
Eosinophils Absolute: 0 10*3/uL (ref 0–0.7)
HEMATOCRIT: 28.6 % — AB (ref 35.0–47.0)
Hemoglobin: 9.4 g/dL — ABNORMAL LOW (ref 12.0–16.0)
LYMPHS ABS: 1.2 10*3/uL (ref 1.0–3.6)
LYMPHS PCT: 16 %
MCH: 26.9 pg (ref 26.0–34.0)
MCHC: 32.8 g/dL (ref 32.0–36.0)
MCV: 81.9 fL (ref 80.0–100.0)
MONO ABS: 0.4 10*3/uL (ref 0.2–0.9)
Monocytes Relative: 5 %
Neutro Abs: 6.1 10*3/uL (ref 1.4–6.5)
Neutrophils Relative %: 78 %
PLATELETS: 238 10*3/uL (ref 150–440)
RBC: 3.49 MIL/uL — AB (ref 3.80–5.20)
RDW: 21.3 % — AB (ref 11.5–14.5)
WBC: 7.8 10*3/uL (ref 3.6–11.0)

## 2015-07-01 MED ORDER — TRAMADOL HCL 50 MG PO TABS
50.0000 mg | ORAL_TABLET | Freq: Once | ORAL | Status: AC
  Administered 2015-07-01: 50 mg via ORAL
  Filled 2015-07-01: qty 1

## 2015-07-01 MED ORDER — OXYCODONE-ACETAMINOPHEN 5-325 MG PO TABS: 1.0000 | ORAL_TABLET | Freq: Three times a day (TID) | ORAL | Status: DC | PRN

## 2015-07-01 NOTE — ED Provider Notes (Signed)
[facility] Emergency Department Provider Note  ____________________________________________  Time seen: Approximately 12:28 PM  I have reviewed the triage vital signs and the nursing notes.   HISTORY  Chief Complaint Leg Pain    HPI Vickie Yang is a 82 y.o. female patient complaining of upper leg pain intermittently for 1 month. Patient denies any history of trauma. Care provider states the patient is currently taking antibiotics for an infected ulcer of the left heel. Care provider does not know the name of the antibiotic but will call home to get the name patient rating her pain as a 9/10. Patient stated moderate control with pain medication. Patient denies any calf pains denies any chest pain or shortness of breath. She was able to talk in full sentences. She shows patient had a closed fracture of the left hip 3 months ago.  Past Medical History  Diagnosis Date  . Coronary artery disease   . Hypertension   . Hip fracture (HCC) 03/30/2015  . Absolute anemia 04/07/2015  . Edema leg 01/20/2015  . Bimalleolar ankle fracture 12/06/2012  . Chronic systolic heart failure (HCC) 05/20/2014    Overview:  Global ef 25%   . Chronic kidney disease (CKD), stage III (moderate) 01/20/2015  . Closed intertrochanteric fracture of femur (HCC) 04/04/2015  . Closed fracture of part of fibula with tibia 12/22/2012  . Arteriosclerosis of coronary artery 12/19/2012    Overview:  Sp cabg 2002 with lima to lad svg om1 and rca with stent to lad 2004   . Clinical depression 12/19/2012  . Fracture of tibial plafond 12/13/2012  . Acid reflux 12/19/2012  . Left leg pain 01/20/2015  . Low serum vitamin D 04/07/2015  . Combined fat and carbohydrate induced hyperlipemia 04/07/2015  . Open ankle wound 02/28/2013  . Ankle pain 12/06/2012  . MI (mitral incompetence) 01/20/2015  . TI (tricuspid incompetence) 01/20/2015  . Breath shortness 10/03/2013    Patient Active Problem List   Diagnosis Date  Noted  . Absolute anemia 04/07/2015  . Low serum vitamin D 04/07/2015  . Combined fat and carbohydrate induced hyperlipemia 04/07/2015  . Closed intertrochanteric fracture of femur (HCC) 04/04/2015  . Hip fracture (HCC) 03/30/2015  . Edema leg 01/20/2015  . Chronic kidney disease (CKD), stage III (moderate) 01/20/2015  . Left leg pain 01/20/2015  . MI (mitral incompetence) 01/20/2015  . TI (tricuspid incompetence) 01/20/2015  . Benign essential HTN 09/13/2014  . Chronic systolic heart failure (HCC) 05/20/2014  . Breath shortness 10/03/2013  . Open ankle wound 02/28/2013  . Closed fracture of part of fibula with tibia 12/22/2012  . Arteriosclerosis of coronary artery 12/19/2012  . Clinical depression 12/19/2012  . Acid reflux 12/19/2012  . Fracture of tibial plafond 12/13/2012  . Bimalleolar ankle fracture 12/06/2012  . Ankle pain 12/06/2012    Past Surgical History  Procedure Laterality Date  . Cardiac surgery    . Coronary artery bypass graft    . Intramedullary (im) nail intertrochanteric Left 03/31/2015    Procedure: INTRAMEDULLARY (IM) NAIL INTERTROCHANTRIC;  Surgeon: Christena Flake, MD;  Location: [facility] ORS;  Service: Orthopedics;  Laterality: Left;    Current Outpatient Rx  Name  Route  Sig  Dispense  Refill  . amLODipine (NORVASC) 10 MG tablet   Oral   Take 5 mg by mouth daily.      0   . aspirin EC 81 MG tablet   Oral   Take 81 mg by mouth daily.         Marland Kitchen  atorvastatin (LIPITOR) 20 MG tablet   Oral   Take 20 mg by mouth daily.      1   . busPIRone (BUSPAR) 5 MG tablet   Oral   Take 5 mg by mouth daily.      0   . carvedilol (COREG) 3.125 MG tablet   Oral   Take 3.125 mg by mouth 2 (two) times daily with a meal.      1   . enoxaparin (LOVENOX) 40 MG/0.4ML injection   Subcutaneous   Inject 0.4 mLs (40 mg total) into the skin daily.   14 Syringe   0   . ibuprofen (ADVIL,MOTRIN) 200 MG tablet   Oral   Take 200 mg by mouth every 6 (six) hours as  needed. For pain.         . isosorbide dinitrate (ISORDIL) 30 MG tablet   Oral   Take 30 mg by mouth daily.      0   . nitroGLYCERIN 2.5 MG CR capsule   Oral   Take by mouth.         Marland Kitchen omeprazole (PRILOSEC) 20 MG capsule   Oral   Take 20 mg by mouth 2 (two) times daily.      0   . oxyCODONE (OXY IR/ROXICODONE) 5 MG immediate release tablet   Oral   Take 1 tablet (5 mg total) by mouth every 6 (six) hours as needed for breakthrough pain.   30 tablet   0   . sertraline (ZOLOFT) 25 MG tablet   Oral   Take 25 mg by mouth daily.      0   . vitamin B-12 (CYANOCOBALAMIN) 500 MCG tablet   Oral   Take 500 mcg by mouth daily.      0     Allergies Review of patient's allergies indicates no known allergies.  Family History  Problem Relation Age of Onset  . Family history unknown: Yes    Social History Social History  Substance Use Topics  . Smoking status: Former Games developer  . Smokeless tobacco: None  . Alcohol Use: No    Review of Systems Constitutional: No fever/chills Eyes: No visual changes. ENT: No sore throat. Cardiovascular: Denies chest pain. Respiratory: Denies shortness of breath. Gastrointestinal: No abdominal pain.  No nausea, no vomiting.  No diarrhea.  No constipation. Genitourinary: Negative for dysuria. Musculoskeletal: Left upper leg pain Skin: Negative for rash. Ulcer left posterior ankle Neurological: Negative for headaches, focal weakness or numbness. Endocrine: Hypertension and chronic kidney disease  ____________________________________________   PHYSICAL EXAM:  VITAL SIGNS: ED Triage Vitals  Enc Vitals Group     BP 07/01/15 1124 117/81 mmHg     Pulse Rate 07/01/15 1124 92     Resp 07/01/15 1124 19     Temp 07/01/15 1124 98.5 F (36.9 C)     Temp Source 07/01/15 1124 Oral     SpO2 07/01/15 1124 100 %     Weight 07/01/15 1124 109 lb (49.442 kg)     Height 07/01/15 1124 5' (1.524 m)     Head Cir --      Peak Flow --       Pain Score 07/01/15 1124 9     Pain Loc --      Pain Edu? --      Excl. in GC? --     Constitutional: Alert and oriented. Well appearing and in no acute distress. Eyes: Conjunctivae are normal. PERRL. EOMI. Head: Atraumatic.  Nose: No congestion/rhinnorhea. Mouth/Throat: Mucous membranes are moist.  Oropharynx non-erythematous. Neck: No stridor.  No cervical spine tenderness to palpation. Hematological/Lymphatic/Immunilogical: No cervical lymphadenopathy. Cardiovascular: Normal rate, regular rhythm. Grossly normal heart sounds.  Good peripheral circulation. Respiratory: Normal respiratory effort.  No retractions. Lungs CTAB. Gastrointestinal: Soft and nontender. No distention. No abdominal bruits. No CVA tenderness. Musculoskeletal: No lower extremity tenderness nor edema.  No joint effusions. Neurologic:  Normal speech and language. No gross focal neurologic deficits are appreciated. No gait instability. Skin:  Skin is warm, dry and intact. No rash noted. Psychiatric: Mood and affect are normal. Speech and behavior are normal.  ____________________________________________   LABS (all labs ordered are listed, but only abnormal results are displayed)  Labs Reviewed  BASIC METABOLIC PANEL - Abnormal; Notable for the following:    Glucose, Bld 111 (*)    BUN 21 (*)    GFR calc non Af Amer 51 (*)    GFR calc Af Amer 59 (*)    All other components within normal limits  CBC WITH DIFFERENTIAL/PLATELET - Abnormal; Notable for the following:    RBC 3.49 (*)    Hemoglobin 9.4 (*)    HCT 28.6 (*)    RDW 21.3 (*)    All other components within normal limits   ____________________________________________  EKG   ____________________________________________  RADIOLOGY  No acute findings x-ray. ____________________________________________   PROCEDURES  Procedure(s) performed: None  Critical Care performed: No  ____________________________________________   INITIAL  IMPRESSION / ASSESSMENT AND PLAN / ED COURSE  Pertinent labs & imaging results that were available during my care of the patient were reviewed by me and considered in my medical decision making (see chart for details).  Chronic leg pain secondary to post ORIF of left hip. Chronic anemia and chronic kidney disease. Discuss x-rays and lab findings with caretaker. Advised follow-up with her treating orthopedist in family doctor. ____________________________________________   FINAL CLINICAL IMPRESSION(S) / ED DIAGNOSES  Final diagnoses:  Chronic leg pain, left      Joni Reining, PA-C 07/01/15 1432  Arnaldo Natal, MD 07/01/15 1723

## 2015-07-01 NOTE — ED Notes (Signed)
PT reports that she has been having pain to left upper leg intermittently for about a month. Pt denies injury. Pt denies other sx's. Pt NAD and A/o x4.

## 2015-07-28 ENCOUNTER — Other Ambulatory Visit: Payer: Self-pay | Admitting: Vascular Surgery

## 2015-07-29 ENCOUNTER — Encounter: Payer: Self-pay | Admitting: *Deleted

## 2015-07-29 ENCOUNTER — Encounter
Admission: RE | Disposition: A | Discharge status: Home / Self Care | Payer: Self-pay | Source: Ambulatory Visit | Attending: Vascular Surgery

## 2015-07-29 ENCOUNTER — Ambulatory Visit
Admission: RE | Admit: 2015-07-29 | Discharge: 2015-07-29 | Disposition: A | Discharge status: Home / Self Care | Payer: Medicare Other | Source: Ambulatory Visit | Attending: Vascular Surgery | Admitting: Vascular Surgery

## 2015-07-29 DIAGNOSIS — L97423 Non-pressure chronic ulcer of left heel and midfoot with necrosis of muscle: Secondary | ICD-10-CM | POA: Insufficient documentation

## 2015-07-29 DIAGNOSIS — I10 Essential (primary) hypertension: Secondary | ICD-10-CM | POA: Diagnosis not present

## 2015-07-29 DIAGNOSIS — M25572 Pain in left ankle and joints of left foot: Secondary | ICD-10-CM | POA: Insufficient documentation

## 2015-07-29 DIAGNOSIS — R008 Other abnormalities of heart beat: Secondary | ICD-10-CM | POA: Diagnosis not present

## 2015-07-29 DIAGNOSIS — I213 ST elevation (STEMI) myocardial infarction of unspecified site: Secondary | ICD-10-CM | POA: Insufficient documentation

## 2015-07-29 DIAGNOSIS — I7025 Atherosclerosis of native arteries of other extremities with ulceration: Secondary | ICD-10-CM | POA: Insufficient documentation

## 2015-07-29 DIAGNOSIS — I509 Heart failure, unspecified: Secondary | ICD-10-CM | POA: Diagnosis not present

## 2015-07-29 DIAGNOSIS — E785 Hyperlipidemia, unspecified: Secondary | ICD-10-CM | POA: Insufficient documentation

## 2015-07-29 DIAGNOSIS — Z79899 Other long term (current) drug therapy: Secondary | ICD-10-CM | POA: Insufficient documentation

## 2015-07-29 DIAGNOSIS — I252 Old myocardial infarction: Secondary | ICD-10-CM | POA: Insufficient documentation

## 2015-07-29 HISTORY — PX: PERIPHERAL VASCULAR CATHETERIZATION: SHX172C

## 2015-07-29 SURGERY — ABDOMINAL AORTOGRAM W/LOWER EXTREMITY
Wound class: Clean

## 2015-07-29 MED ORDER — SODIUM CHLORIDE 0.9 % IV SOLN
INTRAVENOUS | Status: DC
  Administered 2015-07-29: 14:00:00 via INTRAVENOUS

## 2015-07-29 MED ORDER — METHYLPREDNISOLONE SODIUM SUCC 125 MG IJ SOLR: 125.0000 mg | INTRAMUSCULAR | Status: DC | PRN

## 2015-07-29 MED ORDER — HEPARIN SODIUM (PORCINE) 1000 UNIT/ML IJ SOLN
INTRAMUSCULAR | Status: AC
  Filled 2015-07-29: qty 1

## 2015-07-29 MED ORDER — FAMOTIDINE 20 MG PO TABS: 40.0000 mg | ORAL_TABLET | ORAL | Status: DC | PRN

## 2015-07-29 MED ORDER — MIDAZOLAM HCL 2 MG/2ML IJ SOLN
INTRAMUSCULAR | Status: DC | PRN
  Administered 2015-07-29: 2 mg via INTRAVENOUS

## 2015-07-29 MED ORDER — IOPAMIDOL (ISOVUE-300) INJECTION 61%
INTRAVENOUS | Status: DC | PRN
  Administered 2015-07-29: 40 mL via INTRA_ARTERIAL

## 2015-07-29 MED ORDER — FENTANYL CITRATE (PF) 100 MCG/2ML IJ SOLN
INTRAMUSCULAR | Status: DC | PRN
  Administered 2015-07-29: 50 ug via INTRAVENOUS

## 2015-07-29 MED ORDER — HEPARIN (PORCINE) IN NACL 2-0.9 UNIT/ML-% IJ SOLN
INTRAMUSCULAR | Status: AC
  Filled 2015-07-29: qty 1000

## 2015-07-29 MED ORDER — ASPIRIN EC 81 MG PO TBEC: 81.0000 mg | DELAYED_RELEASE_TABLET | Freq: Every day | ORAL | Status: AC

## 2015-07-29 MED ORDER — CLOPIDOGREL BISULFATE 75 MG PO TABS: 75.0000 mg | ORAL_TABLET | Freq: Every day | ORAL | Status: AC

## 2015-07-29 MED ORDER — LIDOCAINE-EPINEPHRINE (PF) 1 %-1:200000 IJ SOLN
INTRAMUSCULAR | Status: AC
  Filled 2015-07-29: qty 30

## 2015-07-29 MED ORDER — ONDANSETRON HCL 4 MG/2ML IJ SOLN: 4.0000 mg | Freq: Four times a day (QID) | INTRAMUSCULAR | Status: DC | PRN

## 2015-07-29 MED ORDER — MIDAZOLAM HCL 5 MG/5ML IJ SOLN
INTRAMUSCULAR | Status: AC
  Filled 2015-07-29: qty 5

## 2015-07-29 MED ORDER — DEXTROSE 5 % IV SOLN
INTRAVENOUS | Status: AC
  Filled 2015-07-29 (×39): qty 1.5

## 2015-07-29 MED ORDER — FENTANYL CITRATE (PF) 100 MCG/2ML IJ SOLN
INTRAMUSCULAR | Status: AC
  Filled 2015-07-29: qty 2

## 2015-07-29 MED ORDER — HEPARIN SODIUM (PORCINE) 1000 UNIT/ML IJ SOLN
INTRAMUSCULAR | Status: DC | PRN
  Administered 2015-07-29: 4000 [IU] via INTRAVENOUS

## 2015-07-29 MED ORDER — LIDOCAINE HCL (PF) 1 % IJ SOLN
INTRAMUSCULAR | Status: DC | PRN
  Administered 2015-07-29: 5 mL via INTRADERMAL

## 2015-07-29 MED ORDER — DEXTROSE 5 % IV SOLN
1.5000 g | INTRAVENOUS | Status: AC
  Administered 2015-07-29: 1.5 g via INTRAVENOUS

## 2015-07-29 MED ORDER — HYDROMORPHONE HCL 1 MG/ML IJ SOLN: 1.0000 mg | Freq: Once | INTRAMUSCULAR | Status: DC

## 2015-07-29 SURGICAL SUPPLY — 20 items
BALLN ARMADA 3.0X60X150 (BALLOONS) ×2
BALLN ARMADA 3X60X150 (BALLOONS) ×3
BALLN ULTRVRSE 2X150X150 (BALLOONS) ×2
BALLN ULTRVRSE 2X150X150 OTW (BALLOONS) ×3
BALLOON ARMADA 3X60X150 (BALLOONS) ×3 IMPLANT
BALLOON ULTRVRSE 2X150X150 OTW (BALLOONS) ×3 IMPLANT
CATH PIG 70CM (CATHETERS) ×5 IMPLANT
CATH RIM 65CM (CATHETERS) ×5 IMPLANT
DEVICE PRESTO INFLATION (MISCELLANEOUS) ×5 IMPLANT
DEVICE STARCLOSE SE CLOSURE (Vascular Products) ×5 IMPLANT
GLIDECATH ANGLED 4FR 120CM (CATHETERS) ×5 IMPLANT
GLIDEWIRE ANGLED SS 035X260CM (WIRE) ×5 IMPLANT
PACK ANGIOGRAPHY (CUSTOM PROCEDURE TRAY) ×5 IMPLANT
SET INTRO CAPELLA COAXIAL (SET/KITS/TRAYS/PACK) ×5 IMPLANT
SHEATH BRITE TIP 5FRX11 (SHEATH) ×5 IMPLANT
SHEATH RAABE 6FR (SHEATH) ×5 IMPLANT
SYR MEDRAD MARK V 150ML (SYRINGE) ×5 IMPLANT
TUBING CONTRAST HIGH PRESS 72 (TUBING) ×5 IMPLANT
WIRE G V18X300CM (WIRE) ×5 IMPLANT
WIRE J 3MM .035X145CM (WIRE) ×5 IMPLANT

## 2015-07-29 NOTE — Op Note (Signed)
[facility]  Percutaneous Study/Intervention Procedural Note   Date of Surgery: 07/29/2015,12:42 PM  Surgeon:, Dolores Lory   Pre-operative Diagnosis: Atherosclerotic occlusive disease bilateral lower extremities with ulceration of the left heel; left heel ulceration with fat and tendon exposed  Post-operative diagnosis:  Same  Procedure(s) Performed:  1.  Abdominal aortogram  2.  Left lower extremity distal runoff third order catheter placement  3.  Percutaneous transluminal angioplasty left perineal artery to 2 mm  4.  Percutaneous transluminal angioplasty left distal popliteal to 3 mm  5.  StarClose closure right common femoral   Anesthesia: Conscious sedation was administered under my direct supervision. IV Versed plus fentanyl were utilized. Continuous ECG, pulse oximetry and blood pressure was monitored throughout the entire procedure. A total of 4 milligrams of Versed and 100 micrograms of fentanyl were utilized.  Conscious sedation was administered for a total of 65 minutes.  Sheath: 6 French rabies sheath right common femoral artery  Contrast: 40 cc   Fluoroscopy Time: 10.2 minutes  Indications:  Ms. Gravelle presented to the office with ulceration of the heel. She is seen at the request of Dr. Elvina Mattes for evaluation of her arterial perfusion in light of the tissue loss. Risks and benefits for angiography was reviewed all questions answered patient and family have agreed to proceed.  Procedure:  Vickie Yang a 82 y.o. female who was identified and appropriate procedural time out was performed.  The patient was then placed supine on the table and prepped and draped in the usual sterile fashion.  Ultrasound was used to evaluate the right common femoral artery.  It was patent .  A digital ultrasound image was acquired.  Amicropuncture needle was used to access the right common femoral artery under direct ultrasound guidance and a permanent image  wassaved for the record.microwire was then advanced under fluoroscopic guidance followed by micro-sheath.  A 0.035 J wire was advanced without resistance and a 5Fr sheath was placed.    Pigtail catheter was positioned the level of T12 and AP projection of the aorta was obtained. Pigtail catheter was repositioned to above the bifurcation and an RAO projection of the pelvis was obtained. Using a rim catheter and a stiff angle Glidewire the bifurcation was crossed the catheter was advanced down to the distal external iliac on the left LAO projection of the femoral artery was then obtained to demonstrate the femoral artery bifurcation. Wire was then reintroduced and negotiated into the SFA and the rim catheter exchanged for a pigtail catheter.  Distal runoff was then completed although the distal images were poor and repeated later in the case with a catheter directed into the peroneal directly. Imaging demonstrated diffuse disease throughout her arterial system however there did not appear to be any hemodynamically significant lesions within the aorta iliac system nor the common femoral superficial femoral arteries. Previously placed superficial femoral artery stent is widely patent. However in the distal popliteal tibioperoneal trunk and tibial vessels themselves there is extensive disease and there appears to be single vessel runoff via the peroneal. Peroneal demonstrates several subtotal occlusions within it's course particularly in the mid and distal one thirds. There is also high-grade stricture stenosis in the distal popliteal and tibioperoneal trunk extending into the peroneal.  4000 units of heparin was given catheter and Glidewire were negotiated into the peroneal were distal runoff was complete by hand injection and then a VAT wire was negotiated down onto the dorsum of the foot.  Initially a  2 mm x 15 cm balloon was used to angioplasty the peroneal beginning at the level of the ankle and extending  proximally 2 separate inflations were made each for 11:59 atmospheres. Subsequently a 3 x 6 balloon was positioned across the distal popliteal tibioperoneal trunk and this was inflated to 14 atm for 1 full minute. Follow-up imaging now demonstrated resolution of the strictures with less than 5% residual stenosis throughout the distal popliteal and peroneal with improved filling of the foot via a large collateral.  Sheath was then pulled into the right external iliac oblique view obtained and a StarClose device deployed without difficulty there were no immediate complications.  Findings:   Aortogram:  The aorta is diffusely diseased and fairly tortuous but there are no hemodynamically significant lesions noted. Bilateral common and external iliac arteries are widely patent although again diffusely diseased.  Right Lower Extremity:  Right common femoral and origin of the profunda femoris and superficial femoral arteries are patent in the visualized segments.  Left Lower Extremity:  The left common femoral profunda femoris are widely patent although as noted above diffusely diseased SFA demonstrates a previously placed stent which is widely patent. Distally the popliteal becomes heavily diseased and there is a hemodynamically significant lesion extending through the distal popliteal tibioperoneal trunk peroneal is the single vessel runoff posterior tibial and anterior tibial are nonvisualized from their origins all the way down to the foot. There are multiple subtotal occlusions within the peroneal.  As noted above treatment with balloon angioplasty to 2 mm in the peroneal and 3 mm in the distal popliteal tibioperoneal trunk yields an excellent result.  Summary: Successful recanalization with in-line flow via the peroneal to the foot.    Disposition: Patient was taken to the recovery room in stable condition having tolerated the procedure well.  , Dolores Lory 07/29/2015,12:42 PM

## 2015-07-29 NOTE — Discharge Instructions (Signed)
Angiogram, Care After °Refer to this sheet in the next few weeks. These instructions provide you with information about caring for yourself after your procedure. Your health care provider may also give you more specific instructions. Your treatment has been planned according to current medical practices, but problems sometimes occur. Call your health care provider if you have any problems or questions after your procedure. °WHAT TO EXPECT AFTER THE PROCEDURE °After your procedure, it is typical to have the following: °· Bruising at the catheter insertion site that usually fades within 1-2 weeks. °· Blood collecting in the tissue (hematoma) that may be painful to the touch. It should usually decrease in size and tenderness within 1-2 weeks. °HOME CARE INSTRUCTIONS °· Take medicines only as directed by your health care provider. °· You may shower 24-48 hours after the procedure or as directed by your health care provider. Remove the bandage (dressing) and gently wash the site with plain soap and water. Pat the area dry with a clean towel. Do not rub the site, because this may cause bleeding. °· Do not take baths, swim, or use a hot tub until your health care provider approves. °· Check your insertion site every day for redness, swelling, or drainage. °· Do not apply powder or lotion to the site. °· Do not lift over 10 lb (4.5 kg) for 5 days after your procedure or as directed by your health care provider. °· Ask your health care provider when it is okay to: °¨ Return to work or school. °¨ Resume usual physical activities or sports. °¨ Resume sexual activity. °· Do not drive home if you are discharged the same day as the procedure. Have someone else drive you. °· You may drive 24 hours after the procedure unless otherwise instructed by your health care provider. °· Do not operate machinery or power tools for 24 hours after the procedure or as directed by your health care provider. °· If your procedure was done as an  outpatient procedure, which means that you went home the same day as your procedure, a responsible adult should be with you for the first 24 hours after you arrive home. °· Keep all follow-up visits as directed by your health care provider. This is important. °SEEK MEDICAL CARE IF: °· You have a fever. °· You have chills. °· You have increased bleeding from the catheter insertion site. Hold pressure on the site. °SEEK IMMEDIATE MEDICAL CARE IF: °· You have unusual pain at the catheter insertion site. °· You have redness, warmth, or swelling at the catheter insertion site. °· You have drainage (other than a small amount of blood on the dressing) from the catheter insertion site. °· The catheter insertion site is bleeding, and the bleeding does not stop after 30 minutes of holding steady pressure on the site. °· The area near or just beyond the catheter insertion site becomes pale, cool, tingly, or numb. °  °This information is not intended to replace advice given to you by your health care provider. Make sure you discuss any questions you have with your health care provider. °  °Document Released: 11/05/2004 Document Revised: 05/10/2014 Document Reviewed: 09/20/2012 °Elsevier Interactive Patient Education ©2016 [employer] ° °

## 2015-07-29 NOTE — H&P (Signed)
[facility] History & Physical Update  The patient was interviewed and re-examined.  The patient's previous History and Physical has been reviewed and is unchanged.  There is no change in the plan of care. We plan to proceed with the scheduled procedure.  Schnier, Latina CraverGregory G, MD  07/29/2015, 11:15 AM

## 2015-07-30 ENCOUNTER — Encounter: Payer: Self-pay | Admitting: Vascular Surgery

## 2015-07-31 ENCOUNTER — Encounter: Payer: Self-pay | Admitting: Vascular Surgery

## 2015-08-18 ENCOUNTER — Other Ambulatory Visit
Admission: RE | Admit: 2015-08-18 | Discharge: 2015-08-18 | Disposition: A | Discharge status: Home / Self Care | Payer: Medicare Other | Source: Ambulatory Visit | Attending: Podiatry | Admitting: Podiatry

## 2015-08-18 DIAGNOSIS — L03116 Cellulitis of left lower limb: Secondary | ICD-10-CM | POA: Diagnosis present

## 2015-08-21 LAB — WOUND CULTURE

## 2015-08-22 LAB — ANAEROBIC CULTURE

## 2015-09-15 ENCOUNTER — Observation Stay
Admission: EM | Admit: 2015-09-15 | Discharge: 2015-09-18 | Disposition: A | Discharge status: Skilled Nursing Facility | Payer: Medicare Other | Attending: Internal Medicine | Admitting: Internal Medicine

## 2015-09-15 ENCOUNTER — Encounter: Payer: Self-pay | Admitting: Emergency Medicine

## 2015-09-15 ENCOUNTER — Emergency Department: Payer: Medicare Other

## 2015-09-15 DIAGNOSIS — L8990 Pressure ulcer of unspecified site, unspecified stage: Secondary | ICD-10-CM | POA: Insufficient documentation

## 2015-09-15 DIAGNOSIS — M5136 Other intervertebral disc degeneration, lumbar region: Secondary | ICD-10-CM | POA: Insufficient documentation

## 2015-09-15 DIAGNOSIS — M25552 Pain in left hip: Secondary | ICD-10-CM | POA: Diagnosis not present

## 2015-09-15 DIAGNOSIS — W06XXXA Fall from bed, initial encounter: Secondary | ICD-10-CM | POA: Diagnosis not present

## 2015-09-15 DIAGNOSIS — G309 Alzheimer's disease, unspecified: Secondary | ICD-10-CM | POA: Diagnosis not present

## 2015-09-15 DIAGNOSIS — Z7902 Long term (current) use of antithrombotics/antiplatelets: Secondary | ICD-10-CM | POA: Insufficient documentation

## 2015-09-15 DIAGNOSIS — I5022 Chronic systolic (congestive) heart failure: Secondary | ICD-10-CM | POA: Diagnosis not present

## 2015-09-15 DIAGNOSIS — M25559 Pain in unspecified hip: Secondary | ICD-10-CM

## 2015-09-15 DIAGNOSIS — Z87891 Personal history of nicotine dependence: Secondary | ICD-10-CM | POA: Insufficient documentation

## 2015-09-15 DIAGNOSIS — M479 Spondylosis, unspecified: Secondary | ICD-10-CM | POA: Diagnosis not present

## 2015-09-15 DIAGNOSIS — Z7982 Long term (current) use of aspirin: Secondary | ICD-10-CM | POA: Diagnosis not present

## 2015-09-15 DIAGNOSIS — S72002A Fracture of unspecified part of neck of left femur, initial encounter for closed fracture: Secondary | ICD-10-CM | POA: Diagnosis not present

## 2015-09-15 DIAGNOSIS — W19XXXA Unspecified fall, initial encounter: Secondary | ICD-10-CM | POA: Diagnosis present

## 2015-09-15 DIAGNOSIS — R262 Difficulty in walking, not elsewhere classified: Secondary | ICD-10-CM | POA: Diagnosis present

## 2015-09-15 DIAGNOSIS — I959 Hypotension, unspecified: Secondary | ICD-10-CM | POA: Diagnosis not present

## 2015-09-15 DIAGNOSIS — M79652 Pain in left thigh: Secondary | ICD-10-CM | POA: Insufficient documentation

## 2015-09-15 DIAGNOSIS — F0280 Dementia in other diseases classified elsewhere without behavioral disturbance: Secondary | ICD-10-CM | POA: Insufficient documentation

## 2015-09-15 DIAGNOSIS — Z09 Encounter for follow-up examination after completed treatment for conditions other than malignant neoplasm: Secondary | ICD-10-CM | POA: Diagnosis not present

## 2015-09-15 DIAGNOSIS — M25462 Effusion, left knee: Secondary | ICD-10-CM | POA: Diagnosis not present

## 2015-09-15 DIAGNOSIS — F329 Major depressive disorder, single episode, unspecified: Secondary | ICD-10-CM | POA: Diagnosis not present

## 2015-09-15 DIAGNOSIS — E782 Mixed hyperlipidemia: Secondary | ICD-10-CM | POA: Diagnosis not present

## 2015-09-15 DIAGNOSIS — N183 Chronic kidney disease, stage 3 unspecified: Secondary | ICD-10-CM | POA: Diagnosis present

## 2015-09-15 DIAGNOSIS — Z8249 Family history of ischemic heart disease and other diseases of the circulatory system: Secondary | ICD-10-CM | POA: Diagnosis not present

## 2015-09-15 DIAGNOSIS — Z951 Presence of aortocoronary bypass graft: Secondary | ICD-10-CM | POA: Insufficient documentation

## 2015-09-15 DIAGNOSIS — Z9889 Other specified postprocedural states: Secondary | ICD-10-CM | POA: Diagnosis not present

## 2015-09-15 DIAGNOSIS — K219 Gastro-esophageal reflux disease without esophagitis: Secondary | ICD-10-CM | POA: Diagnosis not present

## 2015-09-15 DIAGNOSIS — D649 Anemia, unspecified: Secondary | ICD-10-CM | POA: Insufficient documentation

## 2015-09-15 DIAGNOSIS — Z66 Do not resuscitate: Secondary | ICD-10-CM | POA: Insufficient documentation

## 2015-09-15 DIAGNOSIS — I739 Peripheral vascular disease, unspecified: Secondary | ICD-10-CM | POA: Insufficient documentation

## 2015-09-15 DIAGNOSIS — I2510 Atherosclerotic heart disease of native coronary artery without angina pectoris: Secondary | ICD-10-CM | POA: Insufficient documentation

## 2015-09-15 DIAGNOSIS — I129 Hypertensive chronic kidney disease with stage 1 through stage 4 chronic kidney disease, or unspecified chronic kidney disease: Secondary | ICD-10-CM | POA: Diagnosis not present

## 2015-09-15 DIAGNOSIS — I10 Essential (primary) hypertension: Secondary | ICD-10-CM | POA: Diagnosis present

## 2015-09-15 DIAGNOSIS — R52 Pain, unspecified: Secondary | ICD-10-CM | POA: Diagnosis present

## 2015-09-15 LAB — CBC WITH DIFFERENTIAL/PLATELET
BASOS ABS: 0 10*3/uL (ref 0–0.1)
Basophils Relative: 1 %
Eosinophils Absolute: 0.1 10*3/uL (ref 0–0.7)
HCT: 30.6 % — ABNORMAL LOW (ref 35.0–47.0)
Hemoglobin: 9.8 g/dL — ABNORMAL LOW (ref 12.0–16.0)
LYMPHS ABS: 1 10*3/uL (ref 1.0–3.6)
MCH: 27.5 pg (ref 26.0–34.0)
MCHC: 32 g/dL (ref 32.0–36.0)
MCV: 85.8 fL (ref 80.0–100.0)
MONO ABS: 0.4 10*3/uL (ref 0.2–0.9)
Monocytes Relative: 4 %
Neutro Abs: 6.8 10*3/uL — ABNORMAL HIGH (ref 1.4–6.5)
Neutrophils Relative %: 81 %
PLATELETS: 195 10*3/uL (ref 150–440)
RBC: 3.57 MIL/uL — ABNORMAL LOW (ref 3.80–5.20)
RDW: 19.1 % — AB (ref 11.5–14.5)
WBC: 8.4 10*3/uL (ref 3.6–11.0)

## 2015-09-15 LAB — BASIC METABOLIC PANEL
Anion gap: 7 (ref 5–15)
BUN: 27 mg/dL — AB (ref 6–20)
CALCIUM: 9.3 mg/dL (ref 8.9–10.3)
CO2: 26 mmol/L (ref 22–32)
CREATININE: 1.17 mg/dL — AB (ref 0.44–1.00)
Chloride: 106 mmol/L (ref 101–111)
GFR calc Af Amer: 47 mL/min — ABNORMAL LOW (ref >60)
GFR, EST NON AFRICAN AMERICAN: 40 mL/min — AB (ref >60)
GLUCOSE: 116 mg/dL — AB (ref 65–99)
Potassium: 4.7 mmol/L (ref 3.5–5.1)
Sodium: 139 mmol/L (ref 135–145)

## 2015-09-15 MED ORDER — OXYCODONE-ACETAMINOPHEN 5-325 MG PO TABS
1.0000 | ORAL_TABLET | Freq: Once | ORAL | Status: AC
  Administered 2015-09-15: 1 via ORAL
  Filled 2015-09-15: qty 1

## 2015-09-15 MED ORDER — ACETAMINOPHEN 325 MG PO TABS: 650.0000 mg | ORAL_TABLET | Freq: Once | ORAL | Status: DC

## 2015-09-15 NOTE — ED Notes (Signed)
Pt's family reports pt was supposed to use walker and did not. Pt with pain to left mid femur and some tenderness to left hip.

## 2015-09-15 NOTE — ED Notes (Signed)
Patient transported to CT 

## 2015-09-15 NOTE — ED Provider Notes (Signed)
[facility] Emergency Department Provider Note  ____________________________________________   I have reviewed the triage vital signs and the nursing notes.   HISTORY  Chief Complaint Hip Injury    HPI Vickie Yang is a 81 y.o. female his family states he takes aspirin and possibly Plavix no other blood thinners had a non-syncopal fall today. She states she was in her normal state of health when she tripped. She was supposed to be using her walkerand states that she was better family states that she was not. She did not hit her head or pass out. She was with a niece at the time. She has not been able to walk since it happened this morning. Patient normally is able to walk with a walker. She lives at home. She denies any headache chest pain shows breath nausea vomiting numbness weakness, her only complaint is pain to the left hip and down the left femur. Patient has injured that area before multiple times apparently. She also complains of pain to the coccygeal region   Past Medical History  Diagnosis Date  . Coronary artery disease   . Hypertension   . Hip fracture (HCC) 03/30/2015  . Absolute anemia 04/07/2015  . Edema leg 01/20/2015  . Bimalleolar ankle fracture 12/06/2012  . Chronic systolic heart failure (HCC) 05/20/2014    Overview:  Global ef 25%   . Chronic kidney disease (CKD), stage III (moderate) 01/20/2015  . Closed intertrochanteric fracture of femur (HCC) 04/04/2015  . Closed fracture of part of fibula with tibia 12/22/2012  . Arteriosclerosis of coronary artery 12/19/2012    Overview:  Sp cabg 2002 with lima to lad svg om1 and rca with stent to lad 2004   . Clinical depression 12/19/2012  . Fracture of tibial plafond 12/13/2012  . Acid reflux 12/19/2012  . Left leg pain 01/20/2015  . Low serum vitamin D 04/07/2015  . Combined fat and carbohydrate induced hyperlipemia 04/07/2015  . Open ankle wound 02/28/2013  . Ankle pain 12/06/2012  . MI (mitral  incompetence) 01/20/2015  . TI (tricuspid incompetence) 01/20/2015  . Breath shortness 10/03/2013    Patient Active Problem List   Diagnosis Date Noted  . Absolute anemia 04/07/2015  . Low serum vitamin D 04/07/2015  . Combined fat and carbohydrate induced hyperlipemia 04/07/2015  . Closed intertrochanteric fracture of femur (HCC) 04/04/2015  . Hip fracture (HCC) 03/30/2015  . Edema leg 01/20/2015  . Chronic kidney disease (CKD), stage III (moderate) 01/20/2015  . Left leg pain 01/20/2015  . MI (mitral incompetence) 01/20/2015  . TI (tricuspid incompetence) 01/20/2015  . Benign essential HTN 09/13/2014  . Chronic systolic heart failure (HCC) 05/20/2014  . Breath shortness 10/03/2013  . Open ankle wound 02/28/2013  . Closed fracture of part of fibula with tibia 12/22/2012  . Arteriosclerosis of coronary artery 12/19/2012  . Clinical depression 12/19/2012  . Acid reflux 12/19/2012  . Fracture of tibial plafond 12/13/2012  . Bimalleolar ankle fracture 12/06/2012  . Ankle pain 12/06/2012    Past Surgical History  Procedure Laterality Date  . Cardiac surgery    . Coronary artery bypass graft    . Intramedullary (im) nail intertrochanteric Left 03/31/2015    Procedure: INTRAMEDULLARY (IM) NAIL INTERTROCHANTRIC;  Surgeon: Christena Flake, MD;  Location: [facility] ORS;  Service: Orthopedics;  Laterality: Left;  . Peripheral vascular catheterization N/A 07/29/2015    Procedure: Abdominal Aortogram w/Lower Extremity;  Surgeon: Renford Dills, MD;  Location: [facility] INVASIVE CV LAB;  Service:  Cardiovascular;  Laterality: N/A;  . Peripheral vascular catheterization  07/29/2015    Procedure: Lower Extremity Intervention;  Surgeon: Renford Dills, MD;  Location: [facility] INVASIVE CV LAB;  Service: Cardiovascular;;    Current Outpatient Rx  Name  Route  Sig  Dispense  Refill  . amLODipine (NORVASC) 10 MG tablet   Oral   Take 5 mg by mouth daily.      0   . aspirin EC 81 MG tablet   Oral   Take  81 mg by mouth daily.         Marland Kitchen aspirin EC 81 MG tablet   Oral   Take 1 tablet (81 mg total) by mouth daily.   150 tablet   2   . atorvastatin (LIPITOR) 20 MG tablet   Oral   Take 20 mg by mouth daily.      1   . busPIRone (BUSPAR) 5 MG tablet   Oral   Take 5 mg by mouth daily.      0   . carvedilol (COREG) 3.125 MG tablet   Oral   Take 3.125 mg by mouth 2 (two) times daily with a meal.      1   . clopidogrel (PLAVIX) 75 MG tablet   Oral   Take 1 tablet (75 mg total) by mouth daily.   30 tablet   4   . donepezil (ARICEPT) 5 MG tablet   Oral   Take 5 mg by mouth at bedtime.         Marland Kitchen doxycycline (VIBRAMYCIN) 100 MG capsule   Oral   Take 100 mg by mouth 2 (two) times daily.         Marland Kitchen enoxaparin (LOVENOX) 40 MG/0.4ML injection   Subcutaneous   Inject 0.4 mLs (40 mg total) into the skin daily. Patient not taking: Reported on 07/29/2015   14 Syringe   0   . ibuprofen (ADVIL,MOTRIN) 200 MG tablet   Oral   Take 200 mg by mouth every 6 (six) hours as needed. For pain.         . isosorbide dinitrate (ISORDIL) 30 MG tablet   Oral   Take 30 mg by mouth daily.      0   . nitroGLYCERIN 2.5 MG CR capsule   Oral   Take by mouth. Reported on 07/29/2015         . omeprazole (PRILOSEC) 20 MG capsule   Oral   Take 20 mg by mouth 2 (two) times daily. Reported on 07/29/2015      0   . oxyCODONE (OXY IR/ROXICODONE) 5 MG immediate release tablet   Oral   Take 1 tablet (5 mg total) by mouth every 6 (six) hours as needed for breakthrough pain. Patient not taking: Reported on 07/29/2015   30 tablet   0   . oxyCODONE-acetaminophen (ROXICET) 5-325 MG tablet   Oral   Take 1 tablet by mouth every 8 (eight) hours as needed for moderate pain.   12 tablet   0   . sertraline (ZOLOFT) 25 MG tablet   Oral   Take 25 mg by mouth daily.      0   . vitamin B-12 (CYANOCOBALAMIN) 500 MCG tablet   Oral   Take 500 mcg by mouth daily.      0      Allergies Review of patient's allergies indicates no known allergies.  Family History  Problem Relation Age of Onset  . Family history  unknown: Yes    Social History Social History  Substance Use Topics  . Smoking status: Former Games developer  . Smokeless tobacco: None  . Alcohol Use: No    Review of Systems Constitutional: No fever/chills Eyes: No visual changes. ENT: No sore throat. No stiff neck no neck pain Cardiovascular: Denies chest pain. Respiratory: Denies shortness of breath. Gastrointestinal:   no vomiting.  No diarrhea.  No constipation. Genitourinary: Negative for dysuria. Musculoskeletal: Negative lower extremity swelling Skin: Negative for rash. Neurological: Negative for headaches, focal weakness or numbness. 10-point ROS otherwise negative.  ____________________________________________   PHYSICAL EXAM:  VITAL SIGNS: ED Triage Vitals  Enc Vitals Group     BP 09/15/15 1819 144/85 mmHg     Pulse Rate 09/15/15 1819 96     Resp 09/15/15 1819 16     Temp 09/15/15 1819 97.8 F (36.6 C)     Temp Source 09/15/15 1819 Oral     SpO2 09/15/15 1819 93 %     Weight 09/15/15 1819 103 lb (46.72 kg)     Height 09/15/15 1819 5' (1.524 m)     Head Cir --      Peak Flow --      Pain Score --      Pain Loc --      Pain Edu? --      Excl. in GC? --     Constitutional: Alert and orientedTo name and place unsure of the date at baseline per family. Well appearing and in no acute distress. Eyes: Conjunctivae are normal. PERRL. EOMI. Head: Atraumatic. Nose: No congestion/rhinnorhea. Mouth/Throat: Mucous membranes are moist.  Oropharynx non-erythematous. Neck: No stridor.   Nontender with no meningismus Cardiovascular: Normal rate, regular rhythm. Grossly normal heart sounds.  Good peripheral circulation. Respiratory: Normal respiratory effort.  No retractions. Lungs CTAB. Abdominal: Soft and nontender. No distention. No guarding no rebound Back:  There is no focal  tenderness aside from the coccygeal region or step off there is no midline tenderness there are no lesions noted. there is no CVA tenderness Musculoskeletal: Tenderness to palpation in the left hip region and the mid thigh with no obvious deformity. Heart certainly with the leg and her sclerae range it. No obvious knee pain or ankle pain or tenderness. Neurologic:  Normal speech and language. No gross focal neurologic deficits are appreciated.  Skin:  Skin is warm, dry and intact. No rash noted. Psychiatric: Mood and affect are normal. Speech and behavior are normal.  ____________________________________________   LABS (all labs ordered are listed, but only abnormal results are displayed)  Labs Reviewed - No data to display ____________________________________________  EKG  I personally interpreted any EKGs ordered by me or triage  ____________________________________________  RADIOLOGY  I reviewed any imaging ordered by me or triage that were performed during my shift and, if possible, patient and/or family made aware of any abnormal findings. ____________________________________________   PROCEDURES  Procedure(s) performed: None  Critical Care performed: None  ____________________________________________   INITIAL IMPRESSION / ASSESSMENT AND PLAN / ED COURSE  Pertinent labs & imaging results that were available during my care of the patient were reviewed by me and considered in my medical decision making (see chart for details).  Patient had a non-syncopal fall today however has been having great difficulty bearing weight. We have given her pain medication and we will see if she can ambulate. CT scan does not show any acute fracture however there is evidence of a stress fracture of her sacrum  which is of indeterminate age. CT of the head is negative, no other injury noted, patient is at her baseline otherwise. ____________________________________________   FINAL CLINICAL  IMPRESSION(S) / ED DIAGNOSES  Final diagnoses:  Hip pain      This chart was dictated using voice recognition software.  Despite best efforts to proofread,  errors can occur which can change meaning.     Jeanmarie PlantJames A Jermel Artley, MD 09/15/15 2249

## 2015-09-15 NOTE — H&P (Signed)
[facility] [place] [facility]   PATIENT NAME: Vickie Yang    MR#:  [id number]  DATE OF BIRTH:  [birth year]  DATE OF ADMISSION:  09/15/2015  PRIMARY CARE PHYSICIAN: Barbette Reichmann, MD   REQUESTING/REFERRING PHYSICIAN: Alphonzo Lemmings, MD  CHIEF COMPLAINT:   Chief Complaint  Patient presents with  . Hip Injury    HISTORY OF PRESENT ILLNESS:  Vickie Yang  is a 82 y.o. female who presents with Fall at home and subsequent pain. Scans here in the ED did not show any new fracture, she has some old smaller fractures around her hips and pelvic region. However, she is unable to walk after treatment with pain medication the ED due to continued pain. Hospitalists were called for admission and further evaluation.  PAST MEDICAL HISTORY:   Past Medical History  Diagnosis Date  . Coronary artery disease   . Hypertension   . Hip fracture (HCC) 03/30/2015  . Absolute anemia 04/07/2015  . Edema leg 01/20/2015  . Bimalleolar ankle fracture 12/06/2012  . Chronic systolic heart failure (HCC) 05/20/2014    Overview:  Global ef 25%   . Chronic kidney disease (CKD), stage III (moderate) 01/20/2015  . Closed intertrochanteric fracture of femur (HCC) 04/04/2015  . Closed fracture of part of fibula with tibia 12/22/2012  . Arteriosclerosis of coronary artery 12/19/2012    Overview:  Sp cabg 2002 with lima to lad svg om1 and rca with stent to lad 2004   . Clinical depression 12/19/2012  . Fracture of tibial plafond 12/13/2012  . Acid reflux 12/19/2012  . Left leg pain 01/20/2015  . Low serum vitamin D 04/07/2015  . Combined fat and carbohydrate induced hyperlipemia 04/07/2015  . Open ankle wound 02/28/2013  . Ankle pain 12/06/2012  . MI (mitral incompetence) 01/20/2015  . TI (tricuspid incompetence) 01/20/2015  . Breath shortness 10/03/2013    PAST SURGICAL HISTORY:   Past Surgical History  Procedure Laterality Date  . Cardiac surgery    . Coronary artery bypass graft    .  Intramedullary (im) nail intertrochanteric Left 03/31/2015    Procedure: INTRAMEDULLARY (IM) NAIL INTERTROCHANTRIC;  Surgeon: Christena Flake, MD;  Location: [facility] ORS;  Service: Orthopedics;  Laterality: Left;  . Peripheral vascular catheterization N/A 07/29/2015    Procedure: Abdominal Aortogram w/Lower Extremity;  Surgeon: Renford Dills, MD;  Location: [facility] INVASIVE CV LAB;  Service: Cardiovascular;  Laterality: N/A;  . Peripheral vascular catheterization  07/29/2015    Procedure: Lower Extremity Intervention;  Surgeon: Renford Dills, MD;  Location: [facility] INVASIVE CV LAB;  Service: Cardiovascular;;    SOCIAL HISTORY:   Social History  Substance Use Topics  . Smoking status: Former Games developer  . Smokeless tobacco: Not on file  . Alcohol Use: No    FAMILY HISTORY:   Family History  Problem Relation Age of Onset  . Heart disease    . CAD    . Hypertension      DRUG ALLERGIES:  No Known Allergies  MEDICATIONS AT HOME:   Prior to Admission medications   Medication Sig Start Date End Date Taking? Authorizing Provider  amLODipine (NORVASC) 10 MG tablet Take 5 mg by mouth daily. 03/04/15   Historical Provider, MD  aspirin EC 81 MG tablet Take 81 mg by mouth daily.    Historical Provider, MD  aspirin EC 81 MG tablet Take 1 tablet (81 mg total) by mouth daily. 07/29/15   Renford Dills, MD  atorvastatin (LIPITOR) 20 MG tablet  Take 20 mg by mouth daily. 03/21/15   Historical Provider, MD  busPIRone (BUSPAR) 5 MG tablet Take 5 mg by mouth daily. 03/21/15   Historical Provider, MD  carvedilol (COREG) 3.125 MG tablet Take 3.125 mg by mouth 2 (two) times daily with a meal. 03/04/15   Historical Provider, MD  clopidogrel (PLAVIX) 75 MG tablet Take 1 tablet (75 mg total) by mouth daily. 07/29/15   Renford Dills, MD  donepezil (ARICEPT) 5 MG tablet Take 5 mg by mouth at bedtime.    Historical Provider, MD  doxycycline (VIBRAMYCIN) 100 MG capsule Take 100 mg by mouth 2 (two) times daily.     Historical Provider, MD  enoxaparin (LOVENOX) 40 MG/0.4ML injection Inject 0.4 mLs (40 mg total) into the skin daily. Patient not taking: Reported on 07/29/2015 04/02/15   Dedra Skeens, PA-C  ibuprofen (ADVIL,MOTRIN) 200 MG tablet Take 200 mg by mouth every 6 (six) hours as needed. For pain.    Historical Provider, MD  isosorbide dinitrate (ISORDIL) 30 MG tablet Take 30 mg by mouth daily. 03/21/15   Historical Provider, MD  nitroGLYCERIN 2.5 MG CR capsule Take by mouth. Reported on 07/29/2015    Historical Provider, MD  omeprazole (PRILOSEC) 20 MG capsule Take 20 mg by mouth 2 (two) times daily. Reported on 07/29/2015 02/17/15   Historical Provider, MD  oxyCODONE (OXY IR/ROXICODONE) 5 MG immediate release tablet Take 1 tablet (5 mg total) by mouth every 6 (six) hours as needed for breakthrough pain. Patient not taking: Reported on 07/29/2015 04/02/15   Dedra Skeens, PA-C  oxyCODONE-acetaminophen (ROXICET) 5-325 MG tablet Take 1 tablet by mouth every 8 (eight) hours as needed for moderate pain. 07/01/15   Joni Reining, PA-C  sertraline (ZOLOFT) 25 MG tablet Take 25 mg by mouth daily. 03/21/15   Historical Provider, MD  vitamin B-12 (CYANOCOBALAMIN) 500 MCG tablet Take 500 mcg by mouth daily. 02/17/15   Historical Provider, MD    REVIEW OF SYSTEMS:  Review of Systems  Constitutional: Negative for fever, chills, weight loss and malaise/fatigue.  HENT: Negative for ear pain, hearing loss and tinnitus.   Eyes: Negative for blurred vision, double vision, pain and redness.  Respiratory: Negative for cough, hemoptysis and shortness of breath.   Cardiovascular: Negative for chest pain, palpitations, orthopnea and leg swelling.  Gastrointestinal: Negative for nausea, vomiting, abdominal pain, diarrhea and constipation.  Genitourinary: Negative for dysuria, frequency and hematuria.  Musculoskeletal: Positive for joint pain (hip) and falls. Negative for back pain and neck pain.  Skin:       No acne, rash, or  lesions  Neurological: Negative for dizziness, tremors, focal weakness and weakness.  Endo/Heme/Allergies: Negative for polydipsia. Does not bruise/bleed easily.  Psychiatric/Behavioral: Negative for depression. The patient is not nervous/anxious and does not have insomnia.      VITAL SIGNS:   Filed Vitals:   09/15/15 1819  BP: 144/85  Pulse: 96  Temp: 97.8 F (36.6 C)  TempSrc: Oral  Resp: 16  Height: 5' (1.524 m)  Weight: 46.72 kg (103 lb)  SpO2: 93%   Wt Readings from Last 3 Encounters:  09/15/15 46.72 kg (103 lb)  07/29/15 41.731 kg (92 lb)  07/01/15 49.442 kg (109 lb)    PHYSICAL EXAMINATION:  Physical Exam  Vitals reviewed. Constitutional: She is oriented to person, place, and time. She appears well-developed and well-nourished. No distress.  HENT:  Head: Normocephalic and atraumatic.  Mouth/Throat: Oropharynx is clear and moist.  Eyes: Conjunctivae and EOM are  normal. Pupils are equal, round, and reactive to light. No scleral icterus.  Neck: Normal range of motion. Neck supple. No JVD present. No thyromegaly present.  Cardiovascular: Normal rate, regular rhythm and intact distal pulses.  Exam reveals no gallop and no friction rub.   No murmur heard. Respiratory: Effort normal and breath sounds normal. No respiratory distress. She has no wheezes. She has no rales.  GI: Soft. Bowel sounds are normal. She exhibits no distension. There is no tenderness.  Musculoskeletal: Normal range of motion. She exhibits no edema.  No arthritis, no gout  Lymphadenopathy:    She has no cervical adenopathy.  Neurological: She is alert and oriented to person, place, and time. No cranial nerve deficit.  No dysarthria, no aphasia  Skin: Skin is warm and dry. No rash noted. No erythema.  Psychiatric: She has a normal mood and affect. Her behavior is normal. Judgment and thought content normal.    LABORATORY PANEL:   CBC No results for input(s): WBC, HGB, HCT, PLT in the last 168  hours. ------------------------------------------------------------------------------------------------------------------  Chemistries  No results for input(s): NA, K, CL, CO2, GLUCOSE, BUN, CREATININE, CALCIUM, MG, AST, ALT, ALKPHOS, BILITOT in the last 168 hours.  Invalid input(s): GFRCGP ------------------------------------------------------------------------------------------------------------------  Cardiac Enzymes No results for input(s): TROPONINI in the last 168 hours. ------------------------------------------------------------------------------------------------------------------  RADIOLOGY:  Dg Pelvis 1-2 Views  09/15/2015  CLINICAL DATA:  Fall getting out of bed today. Left femur pain. History of left hip fracture. EXAM: PELVIS - 1-2 VIEW COMPARISON:  Multiple exams, including 12/01/2012 an report from 05/30/2015 FINDINGS: Severe bony demineralization. Lower lumbar spondylosis and degenerative disc disease. Vascular calcifications noted. Left hip intramedullary nail, traversing an nonunited left intertrochanteric hip fracture. Deformity of the right-sided pubic rami likely from healed fractures. IMPRESSION: 1. Nonunited left hip fracture, traversed by a long stem intramedullary nail and hip screw. 2. There is some deformity in the left pubic rami, I favor this being from old fracture. 3. Severe bony demineralization. 4. Vascular calcifications. Electronically Signed   By: Gaylyn RongWalter  Liebkemann M.D.   On: 09/15/2015 19:03   Ct Head Wo Contrast  09/15/2015  CLINICAL DATA:  82 year old female with fall EXAM: CT HEAD WITHOUT CONTRAST TECHNIQUE: Contiguous axial images were obtained from the base of the skull through the vertex without intravenous contrast. COMPARISON:  CT dated 12/09/2009 FINDINGS: There is stable moderate age-related atrophy and chronic microvascular ischemic changes. There is dilatation of the ventricles out of proportion with the sulci which may represent central volume  loss versus normal pressure hydrocephalus. Clinical correlation is recommended. There is no acute intracranial hemorrhage. No mass effect or midline shift. All The visualized paranasal sinuses and mastoid air cells are clear. The calvarium is intact. IMPRESSION: No acute intracranial hemorrhage. Stable moderate age-related atrophy and chronic microvascular ischemic changes. Electronically Signed   By: Elgie CollardArash  Radparvar M.D.   On: 09/15/2015 21:35   Ct Pelvis Wo Contrast  09/15/2015  CLINICAL DATA:  Patient fell this morning. Midshaft left femur pain. EXAM: CT OF THE LEFT FEMUR WITHOUT CONTRAST; CT PELVIS WITHOUT CONTRAST TECHNIQUE: Multidetector CT imaging of the pelvis and left femur from hip to the knee was performed according to the standard protocol. Multiplanar CT image reconstructions were also generated. COMPARISON:  Pelvis and left femoral radiographs 09/15/2015. CT abdomen and pelvis 01/10/2009 FINDINGS: Diffuse bone demineralization. Degenerative changes in the lower lumbar spine. Vacuum phenomenon at the SI joints consistent with degenerative change. No displacement of the sacral iliac joints. Old appearing fracture  deformity of the right inferior pubic ramus. Deformity of the left superior pubic ramus likely represents old fracture deformity. No acute displaced fractures identified in the pelvis. The right sacral ala demonstrates linear sclerosis suggesting stress or insufficiency fracture. No displacement. Calcifications in the uterus consistent with fibroids and vascular calcifications. Calcified phleboliths in the pelvis. No free or loculated pelvic fluid collections. Bladder is decompressed. Visualize colon and small bowel are not distended. Heterogeneous fat containing nodule in the upper right pelvis measuring 1.8 cm diameter. Small focal calcification. This is likely to represent an ovarian dermoid. Focal mild infiltration in the left inferior gluteal muscles suggesting small intramuscular  hematoma. There is an old appearing ununited fracture of the inter trochanteric and sub trochanteric region of the left proximal femur with fracture lines visible and callus formation present. This is fixed internally with a intra medullary rod and compression bolt with distal locking screw through the intra medullary rod. The hardware appears well seated. No evidence of fracture or displacement of the surgical hardware. The left femoral shaft and metaphysis seal region appears otherwise intact. No new fractures are identified. No significant effusion at the knee. Diffuse vascular calcifications are present. Small left knee effusion. IMPRESSION: Old ununited fracture of the left hip postoperative internal fixation. No new acute fractures demonstrated in the left hip or left femur. Small left knee effusion. Small hematoma in the left posterior gluteal muscles. Stress or insufficiency fracture in the right sacral ala. Old fracture deformities of the pubic rami. Probable small dermoid cyst in the right ovary. Electronically Signed   By: Burman Nieves M.D.   On: 09/15/2015 21:46   Ct Femur Left Wo Contrast  09/15/2015  CLINICAL DATA:  Patient fell this morning. Midshaft left femur pain. EXAM: CT OF THE LEFT FEMUR WITHOUT CONTRAST; CT PELVIS WITHOUT CONTRAST TECHNIQUE: Multidetector CT imaging of the pelvis and left femur from hip to the knee was performed according to the standard protocol. Multiplanar CT image reconstructions were also generated. COMPARISON:  Pelvis and left femoral radiographs 09/15/2015. CT abdomen and pelvis 01/10/2009 FINDINGS: Diffuse bone demineralization. Degenerative changes in the lower lumbar spine. Vacuum phenomenon at the SI joints consistent with degenerative change. No displacement of the sacral iliac joints. Old appearing fracture deformity of the right inferior pubic ramus. Deformity of the left superior pubic ramus likely represents old fracture deformity. No acute displaced  fractures identified in the pelvis. The right sacral ala demonstrates linear sclerosis suggesting stress or insufficiency fracture. No displacement. Calcifications in the uterus consistent with fibroids and vascular calcifications. Calcified phleboliths in the pelvis. No free or loculated pelvic fluid collections. Bladder is decompressed. Visualize colon and small bowel are not distended. Heterogeneous fat containing nodule in the upper right pelvis measuring 1.8 cm diameter. Small focal calcification. This is likely to represent an ovarian dermoid. Focal mild infiltration in the left inferior gluteal muscles suggesting small intramuscular hematoma. There is an old appearing ununited fracture of the inter trochanteric and sub trochanteric region of the left proximal femur with fracture lines visible and callus formation present. This is fixed internally with a intra medullary rod and compression bolt with distal locking screw through the intra medullary rod. The hardware appears well seated. No evidence of fracture or displacement of the surgical hardware. The left femoral shaft and metaphysis seal region appears otherwise intact. No new fractures are identified. No significant effusion at the knee. Diffuse vascular calcifications are present. Small left knee effusion. IMPRESSION: Old ununited fracture of the left  hip postoperative internal fixation. No new acute fractures demonstrated in the left hip or left femur. Small left knee effusion. Small hematoma in the left posterior gluteal muscles. Stress or insufficiency fracture in the right sacral ala. Old fracture deformities of the pubic rami. Probable small dermoid cyst in the right ovary. Electronically Signed   By: Burman Nieves M.D.   On: 09/15/2015 21:46   Dg Femur Min 2 Views Left  09/15/2015  CLINICAL DATA:  Fall getting out of bed today.  Left mid femur pain. EXAM: LEFT FEMUR 2 VIEWS COMPARISON:  03/31/2015 FINDINGS: Nonunited left subtrochanteric hip  fracture traversed by intramedullary nail and hip screw. Deformity of the right pubic rami. Extensive vascular calcification. Vascular stent in the right SFA at the midshaft level. Single interlocking distal screw in the intramedullary nail. I do not see a new fracture along the shaft or along the distal nail margin. IMPRESSION: 1. No new fracture. Stable appearance of nonunited left proximal femoral fracture traversed by the intramedullary nail. 2. Severe bony demineralization. 3. Extensive vascular calcification, with an expandable stent in the left SFA. 4. Deformity of the right pubic rami, likely from old fractures. Electronically Signed   By: Gaylyn Rong M.D.   On: 09/15/2015 19:05    EKG:   Orders placed or performed during the hospital encounter of 03/30/15  . ED EKG  . ED EKG    IMPRESSION AND PLAN:  Principal Problem:   Fall - no acute fracture. We will order when necessary pain medication tonight, and get a PT and OT consult tomorrow. Active Problems:   Benign essential HTN - at goal, continue home meds   Chronic systolic heart failure (HCC) - table, continue home medications   Arteriosclerosis of coronary artery - and tinea home meds   Chronic kidney disease (CKD), stage III (moderate) - will monitor this closely and avoid nephrotoxins   GERD (gastroesophageal reflux disease) - home dose PPI  All the records are reviewed and case discussed with ED provider. Management plans discussed with the patient and/or family.  DVT PROPHYLAXIS: SubQ heparin  GI PROPHYLAXIS: PPI  ADMISSION STATUS: Observation  CODE STATUS: DNR Code Status History    Date Active Date Inactive Code Status Order ID Comments User Context   03/31/2015  3:59 PM 04/03/2015  4:29 PM DNR [id number]  Christena Flake, MD Inpatient   03/30/2015  2:44 PM 03/31/2015  3:59 PM DNR [id number]  Adrian Saran, MD Inpatient    Questions for Most Recent Historical Code Status (Order [id number])    Question Answer Comment    In the event of cardiac or respiratory ARREST Do not call a "code blue"    In the event of cardiac or respiratory ARREST Do not perform Intubation, CPR, defibrillation or ACLS    In the event of cardiac or respiratory ARREST Use medication by any route, position, wound care, and other measures to relive pain and suffering. May use oxygen, suction and manual treatment of airway obstruction as needed for comfort.       TOTAL TIME TAKING CARE OF THIS PATIENT: 40 minutes.    Esli Clements FIELDING 09/15/2015, 11:11 PM  [employer] [facility]  Office  [phone]  CC: Primary care physician; Barbette Reichmann, MD

## 2015-09-15 NOTE — ED Notes (Signed)
RN and ED Tech attempted to ambulate pt using a walker. Pt reported sharp pains when attempting to move left leg but pt continued to attempt standing. Pt was able to stand for 10 seconds before sitting back down due to discomfort. With second attempt pt was able to move two small steps but almost fell and continued to tip walker over on left side due to placing too much pressure on left side of walker. Pt almost fell one more time in attempting to sit back down on the bed. MD made aware. Pt is unsteady on feet and does not appear able to bear weight on left leg for extended amounts of time.

## 2015-09-16 ENCOUNTER — Encounter: Payer: Self-pay | Admitting: General Practice

## 2015-09-16 DIAGNOSIS — L8990 Pressure ulcer of unspecified site, unspecified stage: Secondary | ICD-10-CM | POA: Insufficient documentation

## 2015-09-16 LAB — BASIC METABOLIC PANEL
Anion gap: 3 — ABNORMAL LOW (ref 5–15)
BUN: 31 mg/dL — AB (ref 6–20)
CALCIUM: 8.9 mg/dL (ref 8.9–10.3)
CO2: 28 mmol/L (ref 22–32)
Chloride: 107 mmol/L (ref 101–111)
Creatinine, Ser: 1.17 mg/dL — ABNORMAL HIGH (ref 0.44–1.00)
GFR calc Af Amer: 47 mL/min — ABNORMAL LOW (ref >60)
GFR, EST NON AFRICAN AMERICAN: 40 mL/min — AB (ref >60)
GLUCOSE: 138 mg/dL — AB (ref 65–99)
POTASSIUM: 4.5 mmol/L (ref 3.5–5.1)
Sodium: 138 mmol/L (ref 135–145)

## 2015-09-16 LAB — CBC
HEMATOCRIT: 27.1 % — AB (ref 35.0–47.0)
HEMOGLOBIN: 8.8 g/dL — AB (ref 12.0–16.0)
MCH: 27.7 pg (ref 26.0–34.0)
MCHC: 32.7 g/dL (ref 32.0–36.0)
MCV: 84.7 fL (ref 80.0–100.0)
Platelets: 177 10*3/uL (ref 150–440)
RBC: 3.19 MIL/uL — ABNORMAL LOW (ref 3.80–5.20)
RDW: 18.9 % — AB (ref 11.5–14.5)
WBC: 6.5 10*3/uL (ref 3.6–11.0)

## 2015-09-16 MED ORDER — OXYCODONE-ACETAMINOPHEN 5-325 MG PO TABS
1.0000 | ORAL_TABLET | Freq: Three times a day (TID) | ORAL | Status: DC | PRN
  Administered 2015-09-16 (×2): 1 via ORAL
  Filled 2015-09-16 (×2): qty 1

## 2015-09-16 MED ORDER — CARVEDILOL 3.125 MG PO TABS
3.1250 mg | ORAL_TABLET | Freq: Two times a day (BID) | ORAL | Status: DC
  Administered 2015-09-16 – 2015-09-17 (×3): 3.125 mg via ORAL
  Filled 2015-09-16 (×3): qty 1

## 2015-09-16 MED ORDER — MORPHINE SULFATE (PF) 2 MG/ML IV SOLN: 2.0000 mg | INTRAVENOUS | Status: DC | PRN

## 2015-09-16 MED ORDER — SODIUM CHLORIDE 0.9% FLUSH
3.0000 mL | Freq: Two times a day (BID) | INTRAVENOUS | Status: DC
  Administered 2015-09-16 – 2015-09-18 (×6): 3 mL via INTRAVENOUS

## 2015-09-16 MED ORDER — ACETAMINOPHEN 325 MG PO TABS: 650.0000 mg | ORAL_TABLET | Freq: Four times a day (QID) | ORAL | Status: DC | PRN

## 2015-09-16 MED ORDER — ATORVASTATIN CALCIUM 20 MG PO TABS
20.0000 mg | ORAL_TABLET | Freq: Every day | ORAL | Status: DC
  Administered 2015-09-16 – 2015-09-18 (×3): 20 mg via ORAL
  Filled 2015-09-16 (×3): qty 1

## 2015-09-16 MED ORDER — ONDANSETRON HCL 4 MG/2ML IJ SOLN: 4.0000 mg | Freq: Four times a day (QID) | INTRAMUSCULAR | Status: DC | PRN

## 2015-09-16 MED ORDER — ACETAMINOPHEN 650 MG RE SUPP: 650.0000 mg | Freq: Four times a day (QID) | RECTAL | Status: DC | PRN

## 2015-09-16 MED ORDER — SERTRALINE HCL 50 MG PO TABS
25.0000 mg | ORAL_TABLET | Freq: Every day | ORAL | Status: DC
  Administered 2015-09-16 – 2015-09-18 (×3): 25 mg via ORAL
  Filled 2015-09-16 (×3): qty 1

## 2015-09-16 MED ORDER — AMLODIPINE BESYLATE 5 MG PO TABS
5.0000 mg | ORAL_TABLET | Freq: Every day | ORAL | Status: DC
  Administered 2015-09-16 – 2015-09-18 (×3): 5 mg via ORAL
  Filled 2015-09-16 (×3): qty 1

## 2015-09-16 MED ORDER — PANTOPRAZOLE SODIUM 40 MG PO TBEC
40.0000 mg | DELAYED_RELEASE_TABLET | Freq: Every day | ORAL | Status: DC
  Administered 2015-09-16 – 2015-09-18 (×3): 40 mg via ORAL
  Filled 2015-09-16 (×3): qty 1

## 2015-09-16 MED ORDER — ONDANSETRON HCL 4 MG PO TABS: 4.0000 mg | ORAL_TABLET | Freq: Four times a day (QID) | ORAL | Status: DC | PRN

## 2015-09-16 MED ORDER — DOCUSATE SODIUM 100 MG PO CAPS
100.0000 mg | ORAL_CAPSULE | Freq: Two times a day (BID) | ORAL | Status: DC
  Administered 2015-09-16 – 2015-09-18 (×5): 100 mg via ORAL
  Filled 2015-09-16 (×5): qty 1

## 2015-09-16 MED ORDER — DONEPEZIL HCL 5 MG PO TABS
5.0000 mg | ORAL_TABLET | Freq: Every day | ORAL | Status: DC
  Administered 2015-09-16 – 2015-09-17 (×3): 5 mg via ORAL
  Filled 2015-09-16 (×3): qty 1

## 2015-09-16 MED ORDER — HEPARIN SODIUM (PORCINE) 5000 UNIT/ML IJ SOLN
5000.0000 [IU] | Freq: Three times a day (TID) | INTRAMUSCULAR | Status: DC
  Administered 2015-09-16 – 2015-09-18 (×8): 5000 [IU] via SUBCUTANEOUS
  Filled 2015-09-16 (×8): qty 1

## 2015-09-16 MED ORDER — CLOPIDOGREL BISULFATE 75 MG PO TABS
75.0000 mg | ORAL_TABLET | Freq: Every day | ORAL | Status: DC
  Administered 2015-09-16 – 2015-09-18 (×3): 75 mg via ORAL
  Filled 2015-09-16 (×3): qty 1

## 2015-09-16 MED ORDER — ASPIRIN EC 81 MG PO TBEC
81.0000 mg | DELAYED_RELEASE_TABLET | Freq: Every day | ORAL | Status: DC
  Administered 2015-09-16 – 2015-09-18 (×3): 81 mg via ORAL
  Filled 2015-09-16 (×3): qty 1

## 2015-09-16 MED ORDER — OXYCODONE-ACETAMINOPHEN 5-325 MG PO TABS
1.0000 | ORAL_TABLET | ORAL | Status: DC | PRN
  Administered 2015-09-16 – 2015-09-18 (×5): 1 via ORAL
  Filled 2015-09-16 (×6): qty 1

## 2015-09-16 NOTE — NC FL2 (Signed)
[place] MEDICAID FL2 LEVEL OF CARE SCREENING TOOL     IDENTIFICATION  Patient Name: Vickie Yang Birthdate: 10-Mar-1925 Sex: female Admission Date (Current Location): 09/15/2015  [place]   Facility and Address:  [facility], [address], [place], [place] [address]      Provider Number: [id number]  Attending Physician Name and Address:  Katha HammingSnehalatha Konidena, MD  Relative Name and Phone Number:       Current Level of Care: Hospital Recommended Level of Care: Skilled Nursing Facility Prior Approval Number:    Date Approved/Denied:   PASRR Number:  ( [id number] )  Discharge Plan: SNF    Current Diagnoses: Patient Active Problem List   Diagnosis Date Noted  . Pressure ulcer 09/16/2015  . Fall 09/15/2015  . Absolute anemia 04/07/2015  . Low serum vitamin D 04/07/2015  . Combined fat and carbohydrate induced hyperlipemia 04/07/2015  . Closed intertrochanteric fracture of femur (HCC) 04/04/2015  . Hip fracture (HCC) 03/30/2015  . Edema leg 01/20/2015  . Chronic kidney disease (CKD), stage III (moderate) 01/20/2015  . Left leg pain 01/20/2015  . MI (mitral incompetence) 01/20/2015  . TI (tricuspid incompetence) 01/20/2015  . Benign essential HTN 09/13/2014  . Chronic systolic heart failure (HCC) 05/20/2014  . Breath shortness 10/03/2013  . Open ankle wound 02/28/2013  . Closed fracture of part of fibula with tibia 12/22/2012  . Arteriosclerosis of coronary artery 12/19/2012  . Clinical depression 12/19/2012  . GERD (gastroesophageal reflux disease) 12/19/2012  . Fracture of tibial plafond 12/13/2012  . Bimalleolar ankle fracture 12/06/2012  . Ankle pain 12/06/2012    Orientation RESPIRATION BLADDER Height & Weight     Self  Normal Continent Weight: 94 lb 14.4 oz (43.046 kg) Height:  5' (152.4 cm)  BEHAVIORAL SYMPTOMS/MOOD NEUROLOGICAL BOWEL NUTRITION STATUS   (none )  (none ) Continent Diet (Diet:  Heart Healthy )  AMBULATORY STATUS COMMUNICATION OF NEEDS Skin   Extensive Assist Verbally PU Stage and Appropriate Care (Pressure Ulcer Stage 3: Left Heel )                       Personal Care Assistance Level of Assistance  Bathing, Feeding, Dressing Bathing Assistance: Limited assistance Feeding assistance: Independent Dressing Assistance: Limited assistance     Functional Limitations Info  Sight, Hearing, Speech Sight Info: Adequate Hearing Info: Impaired Speech Info: Adequate    SPECIAL CARE FACTORS FREQUENCY  PT (By licensed PT), OT (By licensed OT)     PT Frequency:  (5) OT Frequency:  (5)            Contractures      Additional Factors Info  Code Status, Allergies Code Status Info:  (DNR ) Allergies Info:  (No Known Allergies )           Current Medications (09/16/2015):  This is the current hospital active medication list Current Facility-Administered Medications  Medication Dose Route Frequency Provider Last Rate Last Dose  . acetaminophen (TYLENOL) tablet 650 mg  650 mg Oral Q6H PRN Oralia Manisavid Willis, MD       Or  . acetaminophen (TYLENOL) suppository 650 mg  650 mg Rectal Q6H PRN Oralia Manisavid Willis, MD      . amLODipine (NORVASC) tablet 5 mg  5 mg Oral Daily Oralia Manisavid Willis, MD   5 mg at 09/16/15 0849  . aspirin EC tablet 81 mg  81 mg Oral Daily Oralia Manisavid Willis, MD   [phone]  mg at 09/16/15 0851  . atorvastatin (LIPITOR) tablet 20 mg  20 mg Oral Daily Oralia Manis, MD   20 mg at 09/16/15 0850  . carvedilol (COREG) tablet 3.125 mg  3.125 mg Oral BID WC Oralia Manis, MD   3.125 mg at 09/16/15 0850  . clopidogrel (PLAVIX) tablet 75 mg  75 mg Oral Daily Oralia Manis, MD   75 mg at 09/16/15 0849  . donepezil (ARICEPT) tablet 5 mg  5 mg Oral QHS Oralia Manis, MD   5 mg at 09/16/15 0051  . heparin injection 5,000 Units  5,000 Units Subcutaneous Q8H Oralia Manis, MD   5,000 Units at 09/16/15 [phone]  . morphine 2 MG/ML injection 2 mg  2 mg Intravenous Q4H PRN Oralia Manis, MD       . ondansetron [facility]) tablet 4 mg  4 mg Oral Q6H PRN Oralia Manis, MD       Or  . ondansetron [facility]) injection 4 mg  4 mg Intravenous Q6H PRN Oralia Manis, MD      . oxyCODONE-acetaminophen (PERCOCET/ROXICET) 5-325 MG per tablet 1 tablet  1 tablet Oral Q4H PRN Katha Hamming, MD      . pantoprazole (PROTONIX) EC tablet 40 mg  40 mg Oral Daily Oralia Manis, MD   40 mg at 09/16/15 0850  . sertraline (ZOLOFT) tablet 25 mg  25 mg Oral Daily Oralia Manis, MD   25 mg at 09/16/15 0849  . sodium chloride flush (NS) 0.9 % injection 3 mL  3 mL Intravenous Q12H Oralia Manis, MD   3 mL at 09/16/15 9604     Discharge Medications: Please see discharge summary for a list of discharge medications.  Relevant Imaging Results:  Relevant Lab Results:   Additional Information  (SSN: [id number])  Haig Prophet, LCSW

## 2015-09-16 NOTE — Progress Notes (Signed)
Pt requesting pain med.  Percocet ordered every 8 hours and it is not time for it.  MD Luberta MutterKonidena gave order to change percocet to every 4 hours PRN

## 2015-09-16 NOTE — Care Management Obs Status (Signed)
MEDICARE OBSERVATION STATUS NOTIFICATION   Patient Details  Name: Pecolia AdesMary J Ambrosio MRN: [id number] Date of Birth: 11-Feb-1925   Medicare Observation Status Notification Given:  Yes    Collie Siadngela Elouise Divelbiss, RN 09/16/2015, 7:48 AM

## 2015-09-16 NOTE — Progress Notes (Signed)
Spoke with central telemetry, Pt's HR increased to 130-140's with activity, highest reading was 150. Pt resting at this time, HR in 100's. Will continue to monitor and notify MD.

## 2015-09-16 NOTE — Evaluation (Signed)
Occupational Therapy Evaluation Patient Details Name: Vickie Yang MRN: [id number] DOB: [birth year] Today's Date: 09/16/2015    History of Present Illness 82 yo F presented to ED after a fall with L hip pain. Imaging in ED did not show any new fracture, she has some old smaller fractures around her hips and pelvic region. PMH includes CAD, mitral/tricuspid incompetence, hip fracture and femur fracture.   Clinical Impression   Patient was seen for OT evaluation this date.  She lives alone in a one story home and has a walker and shower seat.  She fell at home, reports she was out in the yard, "being nosey", cannot recall details and reports she did not have her walker with her.  She appears confused at times and may be a poor historian of recent events, however she is also very hard of hearing as well.  She presents with increased pain in the left hip and leg and has increased difficulty with transfers, unable to ambulate at this time, muscle weakness and has decreased ability to perform self care tasks.  She would benefit from skilled OT to maximize safety and independence in daily tasks.  Recommend she have short term rehab prior to returning home since she lives alone.  She does have a supportive family who can assist with household tasks.     Follow Up Recommendations  SNF    Equipment Recommendations       Recommendations for Other Services       Precautions / Restrictions Precautions Precautions: Fall Restrictions Weight Bearing Restrictions: No      Mobility Bed Mobility  Per PT: Overal bed mobility: Needs Assistance Bed Mobility: Supine to Sit     Supine to sit: Mod assist;HOB elevated     General bed mobility comments: difficulty due to pain in L LE  Transfers Overall transfer level: Needs assistance Equipment used: Rolling walker (2 wheeled) Transfers: Sit to/from Stand Sit to Stand: Max assist Stand pivot transfers: Max assist       General transfer  comment: Cues for hand placement, anterior weight shifting, keeping walker close and pushing down with UEs to reduce LE pain.    Balance Overall balance assessment: History of Falls;Needs assistance Sitting-balance support: Bilateral upper extremity supported;Feet supported Sitting balance-Leahy Scale: Fair     Standing balance support: Bilateral upper extremity supported Standing balance-Leahy Scale: Zero Standing balance comment: unable to maintain without heavy assistance                            ADL Overall ADL's : Needs assistance/impaired Eating/Feeding: Set up   Grooming: Set up   Upper Body Bathing: Set up   Lower Body Bathing: Set up;Minimal assistance   Upper Body Dressing : Set up   Lower Body Dressing: Set up;Maximal assistance   Toilet Transfer: +2 for physical assistance   Toileting- Clothing Manipulation and Hygiene: Maximal assistance       Functional mobility during ADLs: +2 for physical assistance General ADL Comments: Patient is limited by pain with any standing activities and required increased assist for transfers.    Patient seen this date for lower body dressing, able to don and doff right sock but requires max assist for left sock, and underwear.  Difficulty standing to negotiate clothing over hips due to pain in left LE.  Will need additional therapy to address self care tasks.   Vision     Perception  Praxis      Pertinent Vitals/Pain Pain Assessment: 0-10 Pain Score: 5  Pain Descriptors / Indicators: Aching Pain Intervention(s): Limited activity within patient's tolerance;Repositioned;Monitored during session     Hand Dominance Right   Extremity/Trunk Assessment Upper Extremity Assessment Upper Extremity Assessment: Generalized weakness   Lower Extremity Assessment Lower Extremity Assessment: Defer to PT evaluation LLE Deficits / Details: L LE grossly 2+/5 LLE: Unable to fully assess due to pain        Communication Communication Communication: HOH   Cognition Arousal/Alertness: Awake/alert Behavior During Therapy: WFL for tasks assessed/performed Overall Cognitive Status: No family/caregiver present to determine baseline cognitive functioning       Memory: Decreased short-term memory             General Comments       Exercises Exercises: Other exercises Other Exercises Other Exercises: B LE supine therex: ankle pumps, QS, GS, heel slides and hip abd slides x10 each. AAROM with L LE. Other Exercises: Multiple stand pivot transfers to/from various surfaces with FWW and mod A +2. Cues for hand placement, anterior weight shifting, keeping FWW close, and pushing down with UEs to reduce LE pain. Other Exercises: Static standing with FWW and mod A to maintain x3 minutes. Cues for anterior weight shifting.   Shoulder Instructions      Home Living Family/patient expects to be discharged to:: Private residence Living Arrangements: Alone Available Help at Discharge: Family;Available PRN/intermittently Type of Home: House Home Access: Level entry     Home Layout: One level     Bathroom Shower/Tub: Tub/shower unit;Curtain Shower/tub characteristics: Engineer, building servicesCurtain Bathroom Toilet: Standard     Home Equipment: Environmental consultantWalker - 2 wheels;Shower seat   Additional Comments: Pt mainly lives alone, but family stays with her from time to time.  she has 5 children who are in and out all the time.       Prior Functioning/Environment Level of Independence: Needs assistance  Gait / Transfers Assistance Needed: Reports she is supposed to use walker but frequently does not use.  Reports she fell out in the yard and did not have walker with her.  ADL's / Homemaking Assistance Needed: Meals provided by [employer], family and friends assist with cleaning and transportation        OT Diagnosis: Generalized weakness;Acute pain;Other (comment) (decreased self care tasks )   OT Problem List:  Decreased strength;Impaired balance (sitting and/or standing);Decreased knowledge of precautions;Pain;Decreased safety awareness;Decreased activity tolerance;Decreased knowledge of use of DME or AE   OT Treatment/Interventions: Self-care/ADL training;Therapeutic exercise;DME and/or AE instruction;Therapeutic activities;Patient/family education;Balance training    OT Goals(Current goals can be found in the care plan section) Acute Rehab OT Goals Patient Stated Goal: Patient reports she wants to be able to do more for herself like she did before. OT Goal Formulation: With patient Potential to Achieve Goals: Good  OT Frequency: Min 1X/week   Barriers to D/C:            Co-evaluation              End of Session Equipment Utilized During Treatment: Gait belt;Rolling walker  Activity Tolerance: Patient limited by pain Patient left: in chair;with call bell/phone within reach;with chair alarm set;with nursing/sitter in room   Time: 1015-1040 OT Time Calculation (min): 25 min Charges:  OT General Charges $OT Visit: 1 Procedure OT Evaluation $OT Eval Low Complexity: 1 Procedure OT Treatments $Self Care/Home Management : 8-22 mins G-Codes: OT G-codes **NOT FOR INPATIENT CLASS** Functional Assessment Tool Used: clinical  judgement, ADL assessment, strength testing. Functional Limitation: Self care Self Care Current Status [phone]): At least 60 percent but less than 80 percent impaired, limited or restricted Self Care Goal Status (W1191): At least 1 percent but less than 20 percent impaired, limited or restricted  Moranda Billiot T Caria Transue, OTR/L, CLT Makyle Eslick 09/16/2015, 10:54 AM

## 2015-09-16 NOTE — Clinical Social Work Note (Signed)
Clinical Social Work Assessment  Patient Details  Name: Vickie Yang MRN: [id number] Date of Birth: 1924/10/24  Date of referral:  09/16/15               Reason for consult:  Facility Placement                Permission sought to share information with:  Oceanographeracility Contact Representative Permission granted to share information::  Yes, Verbal Permission Granted  Name::      Skilled Nursing Facility   Agency::   [place]   Relationship::     Contact Information:     Housing/Transportation Living arrangements for the past 2 months:  Single Family Home Source of Information:  Adult Children Patient Interpreter Needed:  None Criminal Activity/Legal Involvement Pertinent to Current Situation/Hospitalization:  No - Comment as needed Significant Relationships:  Adult Children Lives with:  Self Do you feel safe going back to the place where you live?  Yes Need for family participation in patient care:  Yes (Comment)  Care giving concerns:  [place] in [place].    Social Worker assessment / plan:  Visual merchandiserClinical Social Worker (CSW) received verbal consult from RN that PT is recommending SNF. Patient is not alert and oriented so CSW contacted patient's daughter Vickie Yang. Per daughter she is at [facility] for short term rehab herself and she would like for patient to go to [facility] for rehab. CSW explained SNF process. CSW also explained long term care options to daughter including private pay and Medicaid. CSW provided daughter with Vickie StaggersRose Yang's Advice worker(Mediaid worker) telephone number.   CSW presented bed offers to daughter. Daughter chose [facility]. Plan is for patient to D/C to [facility] when stable. CSW will continue to follow and assist as needed.   Employment status:  Disabled (Comment on whether or not currently receiving Disability), Retired Database administratornsurance information:  Managed Medicare PT Recommendations:  Skilled Nursing Facility Information / Referral to  community resources:  Skilled Nursing Facility  Patient/Family's Response to care:  Patient's daughter is agreeable for patient to go to SNF.   Patient/Family's Understanding of and Emotional Response to Diagnosis, Current Treatment, and Prognosis:  Daughter was pleasant and thanked CSW for calling.   Emotional Assessment Appearance:  Appears stated age Attitude/Demeanor/Rapport:  Unable to Assess Affect (typically observed):  Unable to Assess Orientation:  Oriented to Self, Fluctuating Orientation (Suspected and/or reported Sundowners) Alcohol / Substance use:  Not Applicable Psych involvement (Current and /or in the community):  No (Comment)  Discharge Needs  Concerns to be addressed:  Discharge Planning Concerns Readmission within the last 30 days:  No Current discharge risk:  Dependent with Mobility Barriers to Discharge:  Continued Medical Work up   Haig ProphetMorgan, Ennis Heavner G, LCSW 09/16/2015, 1:37 PM

## 2015-09-16 NOTE — Care Management Note (Signed)
Case Management Note  Patient Details  Name: Vickie Yang MRN: [id number] Date of Birth: 12-06-1924  Subjective/Objective:                  Met with patient to discuss discharge planning. Her daughter is Maryagnes Amos [phone] (number found on chart) but patient states that her daughter is in rehab after recent hospitalization. She states she is alone in the home- "grandkids comes and go". She agrees to SNF. She went to [facility] last November. She receives [employer]. She has hired caregivers but she states that will end while she's here.  She has a son names Naoma Diener that she states takes her rarely to appointments.PT is recommending SNF.  Action/Plan: List of home health agencies left with patient .RNCM will continue to follow.   Expected Discharge Date:                  Expected Discharge Plan:     In-House Referral:  Clinical Social Work  Discharge planning Services  CM Consult  Post Acute Care Choice:  Home Health Choice offered to:  Patient  DME Arranged:    DME Agency:     HH Arranged:    [place] Agency:     Status of Service:  In process, will continue to follow  Medicare Important Message Given:    Date Medicare IM Given:    Medicare IM give by:    Date Additional Medicare IM Given:    Additional Medicare Important Message give by:     If discussed at [place] of Stay Meetings, dates discussed:    Additional Comments:  Marshell Garfinkel, RN 09/16/2015, 10:08 AM

## 2015-09-16 NOTE — Progress Notes (Signed)
Pt's daughter (veronica) called this AM. She is POA as pt has dementia. Daughter is currently in rehab at [facility], she is primary caretaker of pt, grandson has been assisting as needed in between. Pt password set up. Please call daughter with any questions/updates at [phone].

## 2015-09-16 NOTE — Progress Notes (Signed)
[facility] [place] [facility]   PATIENT NAME: Vickie Yang    MR#:  [id number]  DATE OF BIRTH:  1924-12-20  SUBJECTIVE:  seen at bedside, admitted yesterday night because of the fall, hip pain, ambulatory difficulties. Has dementia and  she unable to give any other complaints.   CHIEF COMPLAINT:   Chief Complaint  Patient presents with  . Hip Injury    REVIEW OF SYSTEMS:   ROS  . CONSTITUTIONAL: No fever, fatigue or weakness.  EYES: No blurred or double vision.  EARS, NOSE, AND THROAT: No tinnitus or ear pain.  RESPIRATORY: No cough, shortness of breath, wheezing or hemoptysis.  CARDIOVASCULAR: No chest pain, orthopnea, edema.  GASTROINTESTINAL: No nausea, vomiting, diarrhea or abdominal pain.  GENITOURINARY: No dysuria, hematuria.  ENDOCRINE: No polyuria, nocturia,  HEMATOLOGY: No anemia, easy bruising or bleeding SKIN: No rash or lesion. MUSCULOSKELETAL:Has left hip pain more when she moves.  NEUROLOGIC: No tingling, numbness, weakness.  PSYCHIATRY: No anxiety or depression.   DRUG ALLERGIES:  No Known Allergies  VITALS:  Blood pressure 161/74, pulse 79, temperature 98.4 F (36.9 C), temperature source Oral, resp. rate 18, height 5' (1.524 m), weight 43.046 kg (94 lb 14.4 oz), SpO2 99 %.  PHYSICAL EXAMINATION:  GENERAL:  82 y.o.-year-old patient lying in the bed with no acute distress.  EYES: Pupils equal, round, reactive to light and accommodation. No scleral icterus. Extraocular muscles intact.  HEENT: Head atraumatic, normocephalic. Oropharynx and nasopharynx clear.  NECK:  Supple, no jugular venous distention. No thyroid enlargement, no tenderness.  LUNGS: Normal breath sounds bilaterally, no wheezing, rales,rhonchi or crepitation. No use of accessory muscles of respiration.  CARDIOVASCULAR: S1, S2 normal. No murmurs, rubs, or gallops.  ABDOMEN: Soft, nontender, nondistended. Bowel sounds present. No organomegaly or mass.   EXTREMITIES: No pedal edema, cyanosis, or clubbing.  NEUROLOGIC: Cranial nerves II through XII are intact. Muscle strength 5/5 in all extremities. Sensation intact. Gait not checked.  PSYCHIATRIC: The patient is alert and oriented x 3.  SKIN: No obvious rash, lesion, or ulcer.  Musculoskeletal: Patient has tenderness in the left hip revision and it is more when she tries  to lift the left leg.  LABORATORY PANEL:   CBC  Recent Labs Lab 09/16/15 0332  WBC 6.5  HGB 8.8*  HCT 27.1*  PLT 177   ------------------------------------------------------------------------------------------------------------------  Chemistries   Recent Labs Lab 09/16/15 0332  NA 138  K 4.5  CL 107  CO2 28  GLUCOSE 138*  BUN 31*  CREATININE 1.17*  CALCIUM 8.9   ------------------------------------------------------------------------------------------------------------------  Cardiac Enzymes No results for input(s): TROPONINI in the last 168 hours. ------------------------------------------------------------------------------------------------------------------  RADIOLOGY:  Dg Pelvis 1-2 Views  09/15/2015  CLINICAL DATA:  Fall getting out of bed today. Left femur pain. History of left hip fracture. EXAM: PELVIS - 1-2 VIEW COMPARISON:  Multiple exams, including 12/01/2012 an report from 05/30/2015 FINDINGS: Severe bony demineralization. Lower lumbar spondylosis and degenerative disc disease. Vascular calcifications noted. Left hip intramedullary nail, traversing an nonunited left intertrochanteric hip fracture. Deformity of the right-sided pubic rami likely from healed fractures. IMPRESSION: 1. Nonunited left hip fracture, traversed by a long stem intramedullary nail and hip screw. 2. There is some deformity in the left pubic rami, I favor this being from old fracture. 3. Severe bony demineralization. 4. Vascular calcifications. Electronically Signed   By: Gaylyn Rong M.D.   On: 09/15/2015 19:03    Ct Head Wo Contrast  09/15/2015  CLINICAL DATA:  82 year old female with fall EXAM: CT HEAD WITHOUT CONTRAST TECHNIQUE: Contiguous axial images were obtained from the base of the skull through the vertex without intravenous contrast. COMPARISON:  CT dated 12/09/2009 FINDINGS: There is stable moderate age-related atrophy and chronic microvascular ischemic changes. There is dilatation of the ventricles out of proportion with the sulci which may represent central volume loss versus normal pressure hydrocephalus. Clinical correlation is recommended. There is no acute intracranial hemorrhage. No mass effect or midline shift. All The visualized paranasal sinuses and mastoid air cells are clear. The calvarium is intact. IMPRESSION: No acute intracranial hemorrhage. Stable moderate age-related atrophy and chronic microvascular ischemic changes. Electronically Signed   By: Elgie Collard M.D.   On: 09/15/2015 21:35   Ct Pelvis Wo Contrast  09/15/2015  CLINICAL DATA:  Patient fell this morning. Midshaft left femur pain. EXAM: CT OF THE LEFT FEMUR WITHOUT CONTRAST; CT PELVIS WITHOUT CONTRAST TECHNIQUE: Multidetector CT imaging of the pelvis and left femur from hip to the knee was performed according to the standard protocol. Multiplanar CT image reconstructions were also generated. COMPARISON:  Pelvis and left femoral radiographs 09/15/2015. CT abdomen and pelvis 01/10/2009 FINDINGS: Diffuse bone demineralization. Degenerative changes in the lower lumbar spine. Vacuum phenomenon at the SI joints consistent with degenerative change. No displacement of the sacral iliac joints. Old appearing fracture deformity of the right inferior pubic ramus. Deformity of the left superior pubic ramus likely represents old fracture deformity. No acute displaced fractures identified in the pelvis. The right sacral ala demonstrates linear sclerosis suggesting stress or insufficiency fracture. No displacement. Calcifications in the  uterus consistent with fibroids and vascular calcifications. Calcified phleboliths in the pelvis. No free or loculated pelvic fluid collections. Bladder is decompressed. Visualize colon and small bowel are not distended. Heterogeneous fat containing nodule in the upper right pelvis measuring 1.8 cm diameter. Small focal calcification. This is likely to represent an ovarian dermoid. Focal mild infiltration in the left inferior gluteal muscles suggesting small intramuscular hematoma. There is an old appearing ununited fracture of the inter trochanteric and sub trochanteric region of the left proximal femur with fracture lines visible and callus formation present. This is fixed internally with a intra medullary rod and compression bolt with distal locking screw through the intra medullary rod. The hardware appears well seated. No evidence of fracture or displacement of the surgical hardware. The left femoral shaft and metaphysis seal region appears otherwise intact. No new fractures are identified. No significant effusion at the knee. Diffuse vascular calcifications are present. Small left knee effusion. IMPRESSION: Old ununited fracture of the left hip postoperative internal fixation. No new acute fractures demonstrated in the left hip or left femur. Small left knee effusion. Small hematoma in the left posterior gluteal muscles. Stress or insufficiency fracture in the right sacral ala. Old fracture deformities of the pubic rami. Probable small dermoid cyst in the right ovary. Electronically Signed   By: Burman Nieves M.D.   On: 09/15/2015 21:46   Ct Femur Left Wo Contrast  09/15/2015  CLINICAL DATA:  Patient fell this morning. Midshaft left femur pain. EXAM: CT OF THE LEFT FEMUR WITHOUT CONTRAST; CT PELVIS WITHOUT CONTRAST TECHNIQUE: Multidetector CT imaging of the pelvis and left femur from hip to the knee was performed according to the standard protocol. Multiplanar CT image reconstructions were also  generated. COMPARISON:  Pelvis and left femoral radiographs 09/15/2015. CT abdomen and pelvis 01/10/2009 FINDINGS: Diffuse bone demineralization. Degenerative changes in the lower lumbar spine. Vacuum phenomenon at  the SI joints consistent with degenerative change. No displacement of the sacral iliac joints. Old appearing fracture deformity of the right inferior pubic ramus. Deformity of the left superior pubic ramus likely represents old fracture deformity. No acute displaced fractures identified in the pelvis. The right sacral ala demonstrates linear sclerosis suggesting stress or insufficiency fracture. No displacement. Calcifications in the uterus consistent with fibroids and vascular calcifications. Calcified phleboliths in the pelvis. No free or loculated pelvic fluid collections. Bladder is decompressed. Visualize colon and small bowel are not distended. Heterogeneous fat containing nodule in the upper right pelvis measuring 1.8 cm diameter. Small focal calcification. This is likely to represent an ovarian dermoid. Focal mild infiltration in the left inferior gluteal muscles suggesting small intramuscular hematoma. There is an old appearing ununited fracture of the inter trochanteric and sub trochanteric region of the left proximal femur with fracture lines visible and callus formation present. This is fixed internally with a intra medullary rod and compression bolt with distal locking screw through the intra medullary rod. The hardware appears well seated. No evidence of fracture or displacement of the surgical hardware. The left femoral shaft and metaphysis seal region appears otherwise intact. No new fractures are identified. No significant effusion at the knee. Diffuse vascular calcifications are present. Small left knee effusion. IMPRESSION: Old ununited fracture of the left hip postoperative internal fixation. No new acute fractures demonstrated in the left hip or left femur. Small left knee effusion.  Small hematoma in the left posterior gluteal muscles. Stress or insufficiency fracture in the right sacral ala. Old fracture deformities of the pubic rami. Probable small dermoid cyst in the right ovary. Electronically Signed   By: Burman NievesWilliam  Stevens M.D.   On: 09/15/2015 21:46   Dg Femur Min 2 Views Left  09/15/2015  CLINICAL DATA:  Fall getting out of bed today.  Left mid femur pain. EXAM: LEFT FEMUR 2 VIEWS COMPARISON:  03/31/2015 FINDINGS: Nonunited left subtrochanteric hip fracture traversed by intramedullary nail and hip screw. Deformity of the right pubic rami. Extensive vascular calcification. Vascular stent in the right SFA at the midshaft level. Single interlocking distal screw in the intramedullary nail. I do not see a new fracture along the shaft or along the distal nail margin. IMPRESSION: 1. No new fracture. Stable appearance of nonunited left proximal femoral fracture traversed by the intramedullary nail. 2. Severe bony demineralization. 3. Extensive vascular calcification, with an expandable stent in the left SFA. 4. Deformity of the right pubic rami, likely from old fractures. Electronically Signed   By: Gaylyn RongWalter  Liebkemann M.D.   On: 09/15/2015 19:05    EKG:   Orders placed or performed during the hospital encounter of 03/30/15  . ED EKG  . ED EKG    ASSESSMENT AND PLAN:   1 left hip Hip pain due to fall: CT of the left hip did not show any new fracture patient had history (femur fracture repair with  Nail before. physical therapy consult ,patient has severe pain when she moves. Continue pain medicine. #2. Peripheral vascular disease and coronary artery disease: Continue aspirin, statins, Plavix. Essential hypertension controlled continue Coreg. #4 Chronic systolic heart failure with EF 25%; #5 chronic kidney disease stage III 6.Coronary artery disease with history of CABG, stents  7.dementia: continue Aricept.  GERD DNR   All the records are reviewed and case discussed with  Care Management/Social Workerr. Management plans discussed with the patient, family and they are in agreement.  CODE STATUS:   dnr  TOTAL  TIME TAKING CARE OF THIS PATIENT: .   POSSIBLE D/C IN 1-2 DAYS, DEPENDING ON CLINICAL CONDITION.   Katha Hamming M.D on 09/16/2015 at 10:31 AM  Between 7am to 6pm - Pager - [phone]  After 6pm go to [email or web address] - password [email or web address]  [facility]  Office  [phone]  CC: Primary care physician; Barbette Reichmann, MD   Note: This dictation was prepared with Dragon dictation along with smaller phrase technology. Any transcriptional errors that result from this process are unintentional.

## 2015-09-16 NOTE — Evaluation (Signed)
Physical Therapy Evaluation Patient Details Name: Vickie Yang MRN: [id number] DOB: [birth year] Today's Date: 09/16/2015   History of Present Illness  82 yo F presented to ED after a fall with L hip pain. Imaging in ED did not show any new fracture, she has some old smaller fractures around her hips and pelvic region. PMH includes CAD, mitral/tricuspid incompetence, hip fracture and femur fracture.  Clinical Impression  Pt demonstrated generalized weakness and difficulty walking with acute L LE pain after a fall. She requires mod A for bed mobility. Tranfers require mod A +2 and FWW with max cues for technique. Due to poor standing balance and L LE pain, she is unable to ambulate at this time. STR is recommended after hospital discharge to address deficits of strength, balance, transfers and ambulation to progress towards PLOF. Pt will benefit from skilled PT services to increase functional I and mobility for safe discharge.     Follow Up Recommendations SNF    Equipment Recommendations  None recommended by PT (pt has recommended FWW)    Recommendations for Other Services       Precautions / Restrictions Precautions Precautions: Fall Restrictions Weight Bearing Restrictions: No      Mobility  Bed Mobility Overal bed mobility: Needs Assistance Bed Mobility: Supine to Sit     Supine to sit: Mod assist;HOB elevated     General bed mobility comments: difficulty due to pain in L LE  Transfers Overall transfer level: Needs assistance Equipment used: Rolling walker (2 wheeled) Transfers: Sit to/from [employer] Sit to Stand: Mod assist;+2 physical assistance Stand pivot transfers: Mod assist;+2 physical assistance       General transfer comment: Cues for hand placement, anterior weight shifting, keeping walker close and pushing down with UEs to reduce LE pain.  Ambulation/Gait             General Gait Details: unable due to L LE pain and poor standing  balance  Stairs            Wheelchair Mobility    Modified Rankin (Stroke Patients Only)       Balance Overall balance assessment: History of Falls;Needs assistance Sitting-balance support: Bilateral upper extremity supported;Feet supported Sitting balance-Leahy Scale: Fair     Standing balance support: Bilateral upper extremity supported Standing balance-Leahy Scale: Zero Standing balance comment: unable to maintain without heavy assistance                             Pertinent Vitals/Pain Pain Assessment: No/denies pain (no pain at rest, moderate pain with movement)    Home Living Family/patient expects to be discharged to:: Private residence Living Arrangements: Alone Available Help at Discharge: Family;Available PRN/intermittently Type of Home: House Home Access: Level entry     Home Layout: One level Home Equipment: Walker - 2 wheels;Shower seat Additional Comments: Pt mainly lives alone, but family stays with her from time to time.    Prior Function Level of Independence: Needs assistance   Gait / Transfers Assistance Needed: Reports she is supposed to use walker but frequently does not use  ADL's / Homemaking Assistance Needed: Meals provided by [employer], family and friends assist with cleaning and transportation        Hand Dominance   Dominant Hand: Right    Extremity/Trunk Assessment   Upper Extremity Assessment: Generalized weakness           Lower Extremity Assessment: Generalized weakness;LLE deficits/detail  LLE Deficits / Details: L LE grossly 2+/5     Communication   Communication: HOH  Cognition Arousal/Alertness: Awake/alert Behavior During Therapy: WFL for tasks assessed/performed Overall Cognitive Status: No family/caregiver present to determine baseline cognitive functioning                      General Comments General comments (skin integrity, edema, etc.): bandages on B heels     Exercises Other Exercises Other Exercises: B LE supine therex: ankle pumps, QS, GS, heel slides and hip abd slides x10 each. AAROM with L LE. Other Exercises: Multiple stand pivot transfers to/from various surfaces with FWW and mod A +2. Cues for hand placement, anterior weight shifting, keeping FWW close, and pushing down with UEs to reduce LE pain. Other Exercises: Static standing with FWW and mod A to maintain x3 minutes. Cues for anterior weight shifting.      Assessment/Plan    PT Assessment Patient needs continued PT services  PT Diagnosis Difficulty walking;Generalized weakness;Acute pain   PT Problem List Decreased strength;Decreased activity tolerance;Decreased balance;Decreased mobility;Decreased safety awareness;Pain  PT Treatment Interventions DME instruction;Gait training;Therapeutic activities;Therapeutic exercise;Balance training;Neuromuscular re-education;Patient/family education   PT Goals (Current goals can be found in the Care Plan section) Acute Rehab PT Goals Patient Stated Goal: to walk PT Goal Formulation: With patient Time For Goal Achievement: 09/30/15 Potential to Achieve Goals: Fair    Frequency Min 2X/week   Barriers to discharge Decreased caregiver support pt lives alone    Co-evaluation               End of Session Equipment Utilized During Treatment: Gait belt Activity Tolerance: Patient limited by pain Patient left: in chair;with call bell/phone within reach;with chair alarm set Nurse Communication: Mobility status    Functional Assessment Tool Used: clinical judgement Functional Limitation: Mobility: Walking and moving around Mobility: Walking and Moving Around Current Status [phone]([id number]): At least 80 percent but less than 100 percent impaired, limited or restricted Mobility: Walking and Moving Around Goal Status [phone](G8979): At least 20 percent but less than 40 percent impaired, limited or restricted    Time: 0900-0942 PT Time Calculation  (min) (ACUTE ONLY): 42 min   Charges:   PT Evaluation $PT Eval Moderate Complexity: 1 Procedure PT Treatments $Therapeutic Exercise: 8-22 mins $Therapeutic Activity: 8-22 mins   PT G Codes:   PT G-Codes **NOT FOR INPATIENT CLASS** Functional Assessment Tool Used: clinical judgement Functional Limitation: Mobility: Walking and moving around Mobility: Walking and Moving Around Current Status ([phone]): At least 80 percent but less than 100 percent impaired, limited or restricted Mobility: Walking and Moving Around Goal Status [phone](G8979): At least 20 percent but less than 40 percent impaired, limited or restricted    Adelene IdlerMindy Jo Averey Trompeter, PT, DPT  09/16/2015, 10:09 AM [phone]

## 2015-09-16 NOTE — Clinical Social Work Placement (Signed)
   CLINICAL SOCIAL WORK PLACEMENT  NOTE  Date:  09/16/2015  Patient Details  Name: Vickie Yang MRN: [id number] Date of Birth: 1924/05/23  Clinical Social Work is seeking post-discharge placement for this patient at the Skilled  Nursing Facility level of care (*CSW will initial, date and re-position this form in  chart as items are completed):  Yes   Patient/family provided with [facility] Social Work Department's list of facilities offering this level of care within the geographic area requested by the patient (or if unable, by the patient's family).  Yes   Patient/family informed of their freedom to choose among providers that offer the needed level of care, that participate in Medicare, Medicaid or managed care program needed by the patient, have an available bed and are willing to accept the patient.  Yes   Patient/family informed of [facility] ownership interest [facility] and [facility], as well as of the fact that they are under no obligation to receive care at these facilities.  PASRR submitted to EDS on       PASRR number received on       Existing PASRR number confirmed on 09/16/15     FL2 transmitted to all facilities in geographic area requested by pt/family on 09/16/15     FL2 transmitted to all facilities within larger geographic area on       Patient informed that his/her managed care company has contracts with or will negotiate with certain facilities, including the following:        Yes   Patient/family informed of bed offers received.  Patient chooses bed at  [facility]     Physician recommends and patient chooses bed at      Patient to be transferred to   on  .  Patient to be transferred to facility by       Patient family notified on   of transfer.  Name of family member notified:        PHYSICIAN       Additional Comment:    _______________________________________________ Haig ProphetMorgan, Shir Bergman G, LCSW 09/16/2015, 1:36  PM

## 2015-09-17 MED ORDER — SODIUM CHLORIDE 0.9 % IV SOLN
INTRAVENOUS | Status: DC
  Administered 2015-09-17: 21:00:00 via INTRAVENOUS

## 2015-09-17 NOTE — Progress Notes (Addendum)
[facility] - [place] at [facility]   PATIENT NAME: Vickie Yang    MR#:  [id number]  DATE OF BIRTH:  March 01, 1925  SUBJECTIVE:  Still has right hip pain.waititng for NH placement,  CHIEF COMPLAINT:   Chief Complaint  Patient presents with  . Hip Injury    REVIEW OF SYSTEMS:   ROS  . CONSTITUTIONAL: No fever, fatigue or weakness.  EYES: No blurred or double vision.  EARS, NOSE, AND THROAT: No tinnitus or ear pain.  RESPIRATORY: No cough, shortness of breath, wheezing or hemoptysis.  CARDIOVASCULAR: No chest pain, orthopnea, edema.  GASTROINTESTINAL: No nausea, vomiting, diarrhea or abdominal pain.  GENITOURINARY: No dysuria, hematuria.  ENDOCRINE: No polyuria, nocturia,  HEMATOLOGY: No anemia, easy bruising or bleeding SKIN: No rash or lesion. MUSCULOSKELETAL:Has left hip pain more when she moves.  NEUROLOGIC: No tingling, numbness, weakness.  PSYCHIATRY: No anxiety or depression.   DRUG ALLERGIES:  No Known Allergies  VITALS:  Blood pressure 91/59, pulse 86, temperature 98.1 F (36.7 C), temperature source Oral, resp. rate 18, height 5' (1.524 m), weight 45.178 kg (99 lb 9.6 oz), SpO2 97 %.  PHYSICAL EXAMINATION:  GENERAL:  82 y.o.-year-old patient lying in the bed with no acute distress.  EYES: Pupils equal, round, reactive to light and accommodation. No scleral icterus. Extraocular muscles intact.  HEENT: Head atraumatic, normocephalic. Oropharynx and nasopharynx clear.  NECK:  Supple, no jugular venous distention. No thyroid enlargement, no tenderness.  LUNGS: Normal breath sounds bilaterally, no wheezing, rales,rhonchi or crepitation. No use of accessory muscles of respiration.  CARDIOVASCULAR: S1, S2 normal. No murmurs, rubs, or gallops.  ABDOMEN: Soft, nontender, nondistended. Bowel sounds present. No organomegaly or mass.  EXTREMITIES: No pedal edema, cyanosis, or clubbing.  NEUROLOGIC: Cranial nerves II through XII are intact. Muscle  strength 5/5 in all extremities. Sensation intact. Gait not checked.  PSYCHIATRIC: The patient is alert and oriented x 3.  SKIN: No obvious rash, lesion, or ulcer.  Musculoskeletal: Patient has tenderness in the left hip revision and it is more when she tries  to lift the left leg.  LABORATORY PANEL:   CBC  Recent Labs Lab 09/16/15 0332  WBC 6.5  HGB 8.8*  HCT 27.1*  PLT 177   ------------------------------------------------------------------------------------------------------------------  Chemistries   Recent Labs Lab 09/16/15 0332  NA 138  K 4.5  CL 107  CO2 28  GLUCOSE 138*  BUN 31*  CREATININE 1.17*  CALCIUM 8.9   ------------------------------------------------------------------------------------------------------------------  Cardiac Enzymes No results for input(s): TROPONINI in the last 168 hours. ------------------------------------------------------------------------------------------------------------------  RADIOLOGY:  Dg Pelvis 1-2 Views  09/15/2015  CLINICAL DATA:  Fall getting out of bed today. Left femur pain. History of left hip fracture. EXAM: PELVIS - 1-2 VIEW COMPARISON:  Multiple exams, including 12/01/2012 an report from 05/30/2015 FINDINGS: Severe bony demineralization. Lower lumbar spondylosis and degenerative disc disease. Vascular calcifications noted. Left hip intramedullary nail, traversing an nonunited left intertrochanteric hip fracture. Deformity of the right-sided pubic rami likely from healed fractures. IMPRESSION: 1. Nonunited left hip fracture, traversed by a long stem intramedullary nail and hip screw. 2. There is some deformity in the left pubic rami, I favor this being from old fracture. 3. Severe bony demineralization. 4. Vascular calcifications. Electronically Signed   By: Gaylyn Rong M.D.   On: 09/15/2015 19:03   Ct Head Wo Contrast  09/15/2015  CLINICAL DATA:  82 year old female with fall EXAM: CT HEAD WITHOUT CONTRAST  TECHNIQUE: Contiguous axial images were obtained from the  base of the skull through the vertex without intravenous contrast. COMPARISON:  CT dated 12/09/2009 FINDINGS: There is stable moderate age-related atrophy and chronic microvascular ischemic changes. There is dilatation of the ventricles out of proportion with the sulci which may represent central volume loss versus normal pressure hydrocephalus. Clinical correlation is recommended. There is no acute intracranial hemorrhage. No mass effect or midline shift. All The visualized paranasal sinuses and mastoid air cells are clear. The calvarium is intact. IMPRESSION: No acute intracranial hemorrhage. Stable moderate age-related atrophy and chronic microvascular ischemic changes. Electronically Signed   By: Elgie CollardArash  Radparvar M.D.   On: 09/15/2015 21:35   Ct Pelvis Wo Contrast  09/15/2015  CLINICAL DATA:  Patient fell this morning. Midshaft left femur pain. EXAM: CT OF THE LEFT FEMUR WITHOUT CONTRAST; CT PELVIS WITHOUT CONTRAST TECHNIQUE: Multidetector CT imaging of the pelvis and left femur from hip to the knee was performed according to the standard protocol. Multiplanar CT image reconstructions were also generated. COMPARISON:  Pelvis and left femoral radiographs 09/15/2015. CT abdomen and pelvis 01/10/2009 FINDINGS: Diffuse bone demineralization. Degenerative changes in the lower lumbar spine. Vacuum phenomenon at the SI joints consistent with degenerative change. No displacement of the sacral iliac joints. Old appearing fracture deformity of the right inferior pubic ramus. Deformity of the left superior pubic ramus likely represents old fracture deformity. No acute displaced fractures identified in the pelvis. The right sacral ala demonstrates linear sclerosis suggesting stress or insufficiency fracture. No displacement. Calcifications in the uterus consistent with fibroids and vascular calcifications. Calcified phleboliths in the pelvis. No free or loculated  pelvic fluid collections. Bladder is decompressed. Visualize colon and small bowel are not distended. Heterogeneous fat containing nodule in the upper right pelvis measuring 1.8 cm diameter. Small focal calcification. This is likely to represent an ovarian dermoid. Focal mild infiltration in the left inferior gluteal muscles suggesting small intramuscular hematoma. There is an old appearing ununited fracture of the inter trochanteric and sub trochanteric region of the left proximal femur with fracture lines visible and callus formation present. This is fixed internally with a intra medullary rod and compression bolt with distal locking screw through the intra medullary rod. The hardware appears well seated. No evidence of fracture or displacement of the surgical hardware. The left femoral shaft and metaphysis seal region appears otherwise intact. No new fractures are identified. No significant effusion at the knee. Diffuse vascular calcifications are present. Small left knee effusion. IMPRESSION: Old ununited fracture of the left hip postoperative internal fixation. No new acute fractures demonstrated in the left hip or left femur. Small left knee effusion. Small hematoma in the left posterior gluteal muscles. Stress or insufficiency fracture in the right sacral ala. Old fracture deformities of the pubic rami. Probable small dermoid cyst in the right ovary. Electronically Signed   By: Burman NievesWilliam  Stevens M.D.   On: 09/15/2015 21:46   Ct Femur Left Wo Contrast  09/15/2015  CLINICAL DATA:  Patient fell this morning. Midshaft left femur pain. EXAM: CT OF THE LEFT FEMUR WITHOUT CONTRAST; CT PELVIS WITHOUT CONTRAST TECHNIQUE: Multidetector CT imaging of the pelvis and left femur from hip to the knee was performed according to the standard protocol. Multiplanar CT image reconstructions were also generated. COMPARISON:  Pelvis and left femoral radiographs 09/15/2015. CT abdomen and pelvis 01/10/2009 FINDINGS: Diffuse bone  demineralization. Degenerative changes in the lower lumbar spine. Vacuum phenomenon at the SI joints consistent with degenerative change. No displacement of the sacral iliac joints. Old appearing fracture  deformity of the right inferior pubic ramus. Deformity of the left superior pubic ramus likely represents old fracture deformity. No acute displaced fractures identified in the pelvis. The right sacral ala demonstrates linear sclerosis suggesting stress or insufficiency fracture. No displacement. Calcifications in the uterus consistent with fibroids and vascular calcifications. Calcified phleboliths in the pelvis. No free or loculated pelvic fluid collections. Bladder is decompressed. Visualize colon and small bowel are not distended. Heterogeneous fat containing nodule in the upper right pelvis measuring 1.8 cm diameter. Small focal calcification. This is likely to represent an ovarian dermoid. Focal mild infiltration in the left inferior gluteal muscles suggesting small intramuscular hematoma. There is an old appearing ununited fracture of the inter trochanteric and sub trochanteric region of the left proximal femur with fracture lines visible and callus formation present. This is fixed internally with a intra medullary rod and compression bolt with distal locking screw through the intra medullary rod. The hardware appears well seated. No evidence of fracture or displacement of the surgical hardware. The left femoral shaft and metaphysis seal region appears otherwise intact. No new fractures are identified. No significant effusion at the knee. Diffuse vascular calcifications are present. Small left knee effusion. IMPRESSION: Old ununited fracture of the left hip postoperative internal fixation. No new acute fractures demonstrated in the left hip or left femur. Small left knee effusion. Small hematoma in the left posterior gluteal muscles. Stress or insufficiency fracture in the right sacral ala. Old fracture  deformities of the pubic rami. Probable small dermoid cyst in the right ovary. Electronically Signed   By: Burman Nieves M.D.   On: 09/15/2015 21:46   Dg Femur Min 2 Views Left  09/15/2015  CLINICAL DATA:  Fall getting out of bed today.  Left mid femur pain. EXAM: LEFT FEMUR 2 VIEWS COMPARISON:  03/31/2015 FINDINGS: Nonunited left subtrochanteric hip fracture traversed by intramedullary nail and hip screw. Deformity of the right pubic rami. Extensive vascular calcification. Vascular stent in the right SFA at the midshaft level. Single interlocking distal screw in the intramedullary nail. I do not see a new fracture along the shaft or along the distal nail margin. IMPRESSION: 1. No new fracture. Stable appearance of nonunited left proximal femoral fracture traversed by the intramedullary nail. 2. Severe bony demineralization. 3. Extensive vascular calcification, with an expandable stent in the left SFA. 4. Deformity of the right pubic rami, likely from old fractures. Electronically Signed   By: Gaylyn Rong M.D.   On: 09/15/2015 19:05    EKG:   Orders placed or performed during the hospital encounter of 03/30/15  . ED EKG  . ED EKG    ASSESSMENT AND PLAN:   1 left hip Hip pain due to fall: CT of the left hip did not show any new fracture patient had history (femur fracture repair with  Nail before. PT recommends SNF #2. Peripheral vascular disease and coronary artery disease: Continue aspirin, statins, Plavix. #4 Chronic systolic heart failure with EF 25%;hypotensive this am;hold coreg #5 chronic kidney disease stage III 6.Coronary artery disease with history of CABG, stents  7.dementia: continue Aricept.  GERD DNR   All the records are reviewed and case discussed with Care Management/Social Workerr. Management plans discussed with the patient, family and they are in agreement.  CODE STATUS:   dnr  TOTAL TIME TAKING CARE OF THIS PATIENT: .   POSSIBLE D/C IN 1-2 DAYS,  DEPENDING ON CLINICAL CONDITION.   Katha Hamming M.D on 09/17/2015 at 4:45 PM  Between  7am to 6pm - Pager - [phone]  After 6pm go to [email or web address] - password [email or web address]  [facility]  Office  [phone]  CC: Primary care physician; Barbette Reichmann, MD   Note: This dictation was prepared with Dragon dictation along with smaller phrase technology. Any transcriptional errors that result from this process are unintentional.

## 2015-09-17 NOTE — Plan of Care (Signed)
Problem: Pain Managment: Goal: General experience of comfort will improve Outcome: Progressing Pain controlled with PRN medication. No acute distress noted. Resting between care. Bath given tolerated well .

## 2015-09-17 NOTE — Progress Notes (Signed)
Pt with no urine output since lunchtime. Bladder scan showed 50ml. Pt is incontinent, confused, poor PO intake since breakfast. Paged MD, new orders for NS 0.9% at 3275ml/hr. Will continue to monitor.

## 2015-09-17 NOTE — Progress Notes (Signed)
Physical Therapy Treatment Patient Details Name: Vickie Yang MRN: [id number] DOB: 1925-04-03 Today's Date: 09/17/2015    History of Present Illness 82 yo F presented to ED after a fall with L hip pain. Imaging in ED did not show any new fracture, she has some old smaller fractures around her hips and pelvic region. PMH includes CAD, mitral/tricuspid incompetence, hip fracture and femur fracture.    PT Comments    Pt was able to progress towards goals today, requiring less assistance for mobility. Bed mobility with mod A and use of bed rail. STS and stand pivot transfer performed with min A +2 and FWW. She tolerate weight bearing better this session. Current plan remains appropriate. Progress mobility as tolerated.  Follow Up Recommendations  SNF     Equipment Recommendations  None recommended by PT    Recommendations for Other Services       Precautions / Restrictions Precautions Precautions: Fall Restrictions Weight Bearing Restrictions: No    Mobility  Bed Mobility Overal bed mobility: Needs Assistance Bed Mobility: Supine to Sit     Supine to sit: Mod assist;HOB elevated     General bed mobility comments: difficulty due to pain in L LE  Transfers Overall transfer level: Needs assistance Equipment used: Rolling walker (2 wheeled) Transfers: Sit to/from [employer] Sit to Stand: Min assist;+2 physical assistance;From elevated surface Stand pivot transfers: Min assist;+2 physical assistance;From elevated surface       General transfer comment: Cues for hand placement, anterior weight shifting, keeping walker close and pushing down with UEs to reduce LE pain.  Ambulation/Gait             General Gait Details: NT due to poor standing tolerance   Stairs            Wheelchair Mobility    Modified Rankin (Stroke Patients Only)       Balance Overall balance assessment: Needs assistance;History of Falls Sitting-balance support:  No upper extremity supported Sitting balance-Leahy Scale: Fair     Standing balance support: Bilateral upper extremity supported Standing balance-Leahy Scale: Poor Standing balance comment: lightly better stability today requiring less assistance of minA +2 to maintain                    Cognition Arousal/Alertness: Awake/alert Behavior During Therapy: WFL for tasks assessed/performed;Restless Overall Cognitive Status: No family/caregiver present to determine baseline cognitive functioning       Memory: Decreased short-term memory              Exercises Other Exercises Other Exercises: B LE supine therex: ankle pumps, SAQs, heel slides and hip abd slides; seated LAQs x10 each. AAROM with L LE. Other Exercises: STS and stand pivot transfer from bed to chair with min A +2 and FWW. Cues for hand placement, anterior weight shifting, keeping FWW close, and pushing down with UEs to reduce LE pain.    General Comments        Pertinent Vitals/Pain Pain Assessment: No/denies pain (none at rest)    Home Living                      Prior Function            PT Goals (current goals can now be found in the care plan section) Acute Rehab PT Goals Patient Stated Goal: to be independent PT Goal Formulation: With patient Time For Goal Achievement: 09/30/15 Potential to Achieve Goals: Fair Progress towards  PT goals: Progressing toward goals    Frequency  Min 2X/week    PT Plan Current plan remains appropriate    Co-evaluation             End of Session Equipment Utilized During Treatment: Gait belt Activity Tolerance: Patient tolerated treatment well;Patient limited by pain Patient left: in chair;with call bell/phone within reach;with chair alarm set     Time: [id number] PT Time Calculation (min) (ACUTE ONLY): 26 min  Charges:  $Therapeutic Exercise: 8-22 mins $Therapeutic Activity: 8-22 mins                    G Codes:      Vickie IdlerMindy Jo Nailah Luepke,  PT, DPT  09/17/2015, 11:34 AM [phone]

## 2015-09-18 LAB — CBC
HCT: 26.2 % — ABNORMAL LOW (ref 35.0–47.0)
Hemoglobin: 8.5 g/dL — ABNORMAL LOW (ref 12.0–16.0)
MCH: 27.9 pg (ref 26.0–34.0)
MCHC: 32.4 g/dL (ref 32.0–36.0)
MCV: 86.2 fL (ref 80.0–100.0)
PLATELETS: 166 10*3/uL (ref 150–440)
RBC: 3.04 MIL/uL — AB (ref 3.80–5.20)
RDW: 18.6 % — ABNORMAL HIGH (ref 11.5–14.5)
WBC: 6.4 10*3/uL (ref 3.6–11.0)

## 2015-09-18 MED ORDER — HYDROCODONE-ACETAMINOPHEN 5-325 MG PO TABS: 1.0000 | ORAL_TABLET | Freq: Two times a day (BID) | ORAL | Status: AC

## 2015-09-18 MED ORDER — TRAMADOL HCL 50 MG PO TABS: 50.0000 mg | ORAL_TABLET | Freq: Every day | ORAL | Status: AC

## 2015-09-18 NOTE — Progress Notes (Signed)
Patient discharged to SNF. Report called to facility, Marylene LandAngela. Pt left via EMS. No complaints of pain at present.

## 2015-09-18 NOTE — Discharge Summary (Addendum)
Vickie Yang, is a 82 y.o. female  DOB [birth year]  MRN [id number].  Admission date:  09/15/2015  Admitting Physician  Oralia Manis, MD  Discharge Date:  09/18/2015   Primary MD  Barbette Reichmann, MD  Recommendations for primary care physician for things to follow:    follow Up with primary doctor in 1 week   Admission Diagnosis  Hip pain [M25.559] Pain [R52] Inability to walk [R26.2]   Discharge Diagnosis  Hip pain [M25.559] Pain [R52] Inability to walk [R26.2]    Principal Problem:   Fall Active Problems:   Benign essential HTN   Chronic systolic heart failure (HCC)   Chronic kidney disease (CKD), stage III (moderate)   Arteriosclerosis of coronary artery   GERD (gastroesophageal reflux disease)   Pressure ulcer      Past Medical History  Diagnosis Date  . Coronary artery disease   . Hypertension   . Hip fracture (HCC) 03/30/2015  . Absolute anemia 04/07/2015  . Edema leg 01/20/2015  . Bimalleolar ankle fracture 12/06/2012  . Chronic systolic heart failure (HCC) 05/20/2014    Overview:  Global ef 25%   . Chronic kidney disease (CKD), stage III (moderate) 01/20/2015  . Closed intertrochanteric fracture of femur (HCC) 04/04/2015  . Closed fracture of part of fibula with tibia 12/22/2012  . Arteriosclerosis of coronary artery 12/19/2012    Overview:  Sp cabg 2002 with lima to lad svg om1 and rca with stent to lad 2004   . Clinical depression 12/19/2012  . Fracture of tibial plafond 12/13/2012  . Acid reflux 12/19/2012  . Left leg pain 01/20/2015  . Low serum vitamin D 04/07/2015  . Combined fat and carbohydrate induced hyperlipemia 04/07/2015  . Open ankle wound 02/28/2013  . Ankle pain 12/06/2012  . MI (mitral incompetence) 01/20/2015  . TI (tricuspid incompetence) 01/20/2015  . Breath shortness 10/03/2013    Past  Surgical History  Procedure Laterality Date  . Cardiac surgery    . Coronary artery bypass graft    . Intramedullary (im) nail intertrochanteric Left 03/31/2015    Procedure: INTRAMEDULLARY (IM) NAIL INTERTROCHANTRIC;  Surgeon: Christena Flake, MD;  Location: [facility] ORS;  Service: Orthopedics;  Laterality: Left;  . Peripheral vascular catheterization N/A 07/29/2015    Procedure: Abdominal Aortogram w/Lower Extremity;  Surgeon: Renford Dills, MD;  Location: [facility] INVASIVE CV LAB;  Service: Cardiovascular;  Laterality: N/A;  . Peripheral vascular catheterization  07/29/2015    Procedure: Lower Extremity Intervention;  Surgeon: Renford Dills, MD;  Location: [facility] INVASIVE CV LAB;  Service: Cardiovascular;;       History of present illness and  Hospital Course:     Kindly see H&P for history of present illness and admission details, please review complete Labs, Consult reports and Test reports for all details in brief  HPI  from the history and physical done on the day of admission 82 year old female patient with history of dementia admitted because of the fall at home and has left hip pain.racture workup is negative for any hip fractures. Admitted for pain control and physical therapy. She was unable to walk and had continued pain so we admitted her for ambulatory difficulties.   Hospital Course  #1 left hip pain: CT femur on the left side didn't show any fracture, patient had old ununited on the left hip postoperative internal fixation. No acute fractures. Patient had stress fracture in the right sacral alar. But no acute fractures of the left hip or femur area.  Seen by physical therapy they recommended skilled nursing discussed patient's daughter and she is agreeable for [facility]. Patient will go to [facility] for rehabilitation today. Can continue Percocet 5 /3 25 tablet by mouth twice a day for pain. #2 coronary artery disease: Stable. She is on aspirin, Plavix. #3 essential  hypertension: Patient is on Coreg, Norvasc. #4 chronic systolic heart failure: EF 25% by previous history. Stable. No acute flareup this time. #5 chronic kidney disease stage III: Stable. Creatinine 1.17.  Alzheimer's dementia: She is on BuSpar, Aricept. D/w with the patient's daughter  CODE STATUS DO NOT RESUSCITATE Discharge Condition:stable   Follow UP  Follow-up Information    Follow up with [facility] .   Specialty:  Skilled Nursing Facility   Contact information:   [address] [place] [address] [phone]        Discharge Instructions  and  Discharge Medications        Medication List    TAKE these medications        amLODipine 10 MG tablet  Commonly known as:  NORVASC  Take 5 mg by mouth daily.     aspirin EC 81 MG tablet  Take 1 tablet (81 mg total) by mouth daily.     atorvastatin 20 MG tablet  Commonly known as:  LIPITOR  Take 20 mg by mouth daily.     busPIRone 5 MG tablet  Commonly known as:  BUSPAR  Take 5 mg by mouth daily.     carvedilol 3.125 MG tablet  Commonly known as:  COREG  Take 3.125 mg by mouth 2 (two) times daily with a meal.     clopidogrel 75 MG tablet  Commonly known as:  PLAVIX  Take 1 tablet (75 mg total) by mouth daily.     donepezil 5 MG tablet  Commonly known as:  ARICEPT  Take 5 mg by mouth at bedtime.     ergocalciferol 50000 units capsule  Commonly known as:  VITAMIN D2  Take 50,000 Units by mouth once a week.     furosemide 20 MG tablet  Commonly known as:  LASIX  Take 20 mg by mouth 2 (two) times daily.     HYDROcodone-acetaminophen 5-325 MG tablet  Commonly known as:  NORCO/VICODIN  Take 1 tablet by mouth 2 (two) times daily.     ibuprofen 200 MG tablet  Commonly known as:  ADVIL,MOTRIN  Take 200 mg by mouth every 6 (six) hours as needed. For pain.     iron polysaccharides 150 MG capsule  Commonly known as:  NIFEREX  Take 150 mg by mouth daily.      isosorbide dinitrate 30 MG tablet  Commonly known as:  ISORDIL  Take 30 mg by mouth daily.     lisinopril 10 MG tablet  Commonly known as:  PRINIVIL,ZESTRIL  Take 10 mg by mouth daily.     omeprazole 20 MG capsule  Commonly known as:  PRILOSEC  Take 20 mg by mouth 2 (two) times daily. Reported on 07/29/2015     sertraline 25 MG tablet  Commonly known as:  ZOLOFT  Take 25 mg by mouth daily.     traMADol 50 MG tablet  Commonly known as:  ULTRAM  Take 1 tablet (50 mg total) by mouth daily.     vitamin B-12 500 MCG tablet  Commonly known as:  CYANOCOBALAMIN  Take 500 mcg by mouth 2 (two) times daily.  Diet and Activity recommendation: See Discharge Instructions above   Consults obtained - PT   Major procedures and Radiology Reports - PLEASE review detailed and final reports for all details, in brief -     Dg Pelvis 1-2 Views  09/15/2015  CLINICAL DATA:  Fall getting out of bed today. Left femur pain. History of left hip fracture. EXAM: PELVIS - 1-2 VIEW COMPARISON:  Multiple exams, including 12/01/2012 an report from 05/30/2015 FINDINGS: Severe bony demineralization. Lower lumbar spondylosis and degenerative disc disease. Vascular calcifications noted. Left hip intramedullary nail, traversing an nonunited left intertrochanteric hip fracture. Deformity of the right-sided pubic rami likely from healed fractures. IMPRESSION: 1. Nonunited left hip fracture, traversed by a long stem intramedullary nail and hip screw. 2. There is some deformity in the left pubic rami, I favor this being from old fracture. 3. Severe bony demineralization. 4. Vascular calcifications. Electronically Signed   By: Gaylyn Rong M.D.   On: 09/15/2015 19:03   Ct Head Wo Contrast  09/15/2015  CLINICAL DATA:  82 year old female with fall EXAM: CT HEAD WITHOUT CONTRAST TECHNIQUE: Contiguous axial images were obtained from the base of the skull through the vertex without intravenous contrast.  COMPARISON:  CT dated 12/09/2009 FINDINGS: There is stable moderate age-related atrophy and chronic microvascular ischemic changes. There is dilatation of the ventricles out of proportion with the sulci which may represent central volume loss versus normal pressure hydrocephalus. Clinical correlation is recommended. There is no acute intracranial hemorrhage. No mass effect or midline shift. All The visualized paranasal sinuses and mastoid air cells are clear. The calvarium is intact. IMPRESSION: No acute intracranial hemorrhage. Stable moderate age-related atrophy and chronic microvascular ischemic changes. Electronically Signed   By: Elgie Collard M.D.   On: 09/15/2015 21:35   Ct Pelvis Wo Contrast  09/15/2015  CLINICAL DATA:  Patient fell this morning. Midshaft left femur pain. EXAM: CT OF THE LEFT FEMUR WITHOUT CONTRAST; CT PELVIS WITHOUT CONTRAST TECHNIQUE: Multidetector CT imaging of the pelvis and left femur from hip to the knee was performed according to the standard protocol. Multiplanar CT image reconstructions were also generated. COMPARISON:  Pelvis and left femoral radiographs 09/15/2015. CT abdomen and pelvis 01/10/2009 FINDINGS: Diffuse bone demineralization. Degenerative changes in the lower lumbar spine. Vacuum phenomenon at the SI joints consistent with degenerative change. No displacement of the sacral iliac joints. Old appearing fracture deformity of the right inferior pubic ramus. Deformity of the left superior pubic ramus likely represents old fracture deformity. No acute displaced fractures identified in the pelvis. The right sacral ala demonstrates linear sclerosis suggesting stress or insufficiency fracture. No displacement. Calcifications in the uterus consistent with fibroids and vascular calcifications. Calcified phleboliths in the pelvis. No free or loculated pelvic fluid collections. Bladder is decompressed. Visualize colon and small bowel are not distended. Heterogeneous fat  containing nodule in the upper right pelvis measuring 1.8 cm diameter. Small focal calcification. This is likely to represent an ovarian dermoid. Focal mild infiltration in the left inferior gluteal muscles suggesting small intramuscular hematoma. There is an old appearing ununited fracture of the inter trochanteric and sub trochanteric region of the left proximal femur with fracture lines visible and callus formation present. This is fixed internally with a intra medullary rod and compression bolt with distal locking screw through the intra medullary rod. The hardware appears well seated. No evidence of fracture or displacement of the surgical hardware. The left femoral shaft and metaphysis seal region appears otherwise intact. No new fractures are  identified. No significant effusion at the knee. Diffuse vascular calcifications are present. Small left knee effusion. IMPRESSION: Old ununited fracture of the left hip postoperative internal fixation. No new acute fractures demonstrated in the left hip or left femur. Small left knee effusion. Small hematoma in the left posterior gluteal muscles. Stress or insufficiency fracture in the right sacral ala. Old fracture deformities of the pubic rami. Probable small dermoid cyst in the right ovary. Electronically Signed   By: Burman Nieves M.D.   On: 09/15/2015 21:46   Ct Femur Left Wo Contrast  09/15/2015  CLINICAL DATA:  Patient fell this morning. Midshaft left femur pain. EXAM: CT OF THE LEFT FEMUR WITHOUT CONTRAST; CT PELVIS WITHOUT CONTRAST TECHNIQUE: Multidetector CT imaging of the pelvis and left femur from hip to the knee was performed according to the standard protocol. Multiplanar CT image reconstructions were also generated. COMPARISON:  Pelvis and left femoral radiographs 09/15/2015. CT abdomen and pelvis 01/10/2009 FINDINGS: Diffuse bone demineralization. Degenerative changes in the lower lumbar spine. Vacuum phenomenon at the SI joints consistent with  degenerative change. No displacement of the sacral iliac joints. Old appearing fracture deformity of the right inferior pubic ramus. Deformity of the left superior pubic ramus likely represents old fracture deformity. No acute displaced fractures identified in the pelvis. The right sacral ala demonstrates linear sclerosis suggesting stress or insufficiency fracture. No displacement. Calcifications in the uterus consistent with fibroids and vascular calcifications. Calcified phleboliths in the pelvis. No free or loculated pelvic fluid collections. Bladder is decompressed. Visualize colon and small bowel are not distended. Heterogeneous fat containing nodule in the upper right pelvis measuring 1.8 cm diameter. Small focal calcification. This is likely to represent an ovarian dermoid. Focal mild infiltration in the left inferior gluteal muscles suggesting small intramuscular hematoma. There is an old appearing ununited fracture of the inter trochanteric and sub trochanteric region of the left proximal femur with fracture lines visible and callus formation present. This is fixed internally with a intra medullary rod and compression bolt with distal locking screw through the intra medullary rod. The hardware appears well seated. No evidence of fracture or displacement of the surgical hardware. The left femoral shaft and metaphysis seal region appears otherwise intact. No new fractures are identified. No significant effusion at the knee. Diffuse vascular calcifications are present. Small left knee effusion. IMPRESSION: Old ununited fracture of the left hip postoperative internal fixation. No new acute fractures demonstrated in the left hip or left femur. Small left knee effusion. Small hematoma in the left posterior gluteal muscles. Stress or insufficiency fracture in the right sacral ala. Old fracture deformities of the pubic rami. Probable small dermoid cyst in the right ovary. Electronically Signed   By: Burman Nieves M.D.   On: 09/15/2015 21:46   Dg Femur Min 2 Views Left  09/15/2015  CLINICAL DATA:  Fall getting out of bed today.  Left mid femur pain. EXAM: LEFT FEMUR 2 VIEWS COMPARISON:  03/31/2015 FINDINGS: Nonunited left subtrochanteric hip fracture traversed by intramedullary nail and hip screw. Deformity of the right pubic rami. Extensive vascular calcification. Vascular stent in the right SFA at the midshaft level. Single interlocking distal screw in the intramedullary nail. I do not see a new fracture along the shaft or along the distal nail margin. IMPRESSION: 1. No new fracture. Stable appearance of nonunited left proximal femoral fracture traversed by the intramedullary nail. 2. Severe bony demineralization. 3. Extensive vascular calcification, with an expandable stent in the left SFA. 4.  Deformity of the right pubic rami, likely from old fractures. Electronically Signed   By: Gaylyn RongWalter  Liebkemann M.D.   On: 09/15/2015 19:05    Micro Results     No results found for this or any previous visit (from the past 240 hour(s)).     Today   Subjective:   Vickie FearMary Yang today has no headache,no chest abdominal pain,no new weakness tingling or numbness, Some left hip pain with ambulation but much better than before. Stable to go SNF today  Objective:   Blood pressure 162/89, pulse 85, temperature 99.1 F (37.3 C), temperature source Oral, resp. rate 18, height 5' (1.524 m), weight 45.269 kg (99 lb 12.8 oz), SpO2 100 %.   Intake/Output Summary (Last 24 hours) at 09/18/15 0908 Last data filed at 09/18/15 0457  Gross per 24 hour  Intake 1062.5 ml  Output      0 ml  Net 1062.5 ml    Exam Awake Alert,Baseline dementia  No new F.N deficits, Normal affect .AT,PERRAL Supple Neck,No JVD, No cervical lymphadenopathy appriciated.  Symmetrical Chest wall movement, Good air movement bilaterally, CTAB RRR,No Gallops,Rubs or new Murmurs, No Parasternal Heave +ve B.Sounds, Abd Soft, Non tender, No  organomegaly appriciated, No rebound -guarding or rigidity. No Cyanosis, Clubbing or edema, No new Rash or bruise  Data Review   CBC w Diff: Lab Results  Component Value Date   WBC 6.4 09/18/2015   HGB 8.5* 09/18/2015   HCT 26.2* 09/18/2015   PLT 166 09/18/2015   LYMPHOPCT 13% 09/15/2015   MONOPCT 4% 09/15/2015   EOSPCT 1% 09/15/2015   BASOPCT 1% 09/15/2015    CMP: Lab Results  Component Value Date   NA 138 09/16/2015   K 4.5 09/16/2015   CL 107 09/16/2015   CO2 28 09/16/2015   BUN 31* 09/16/2015   CREATININE 1.17* 09/16/2015  .   Total Time in preparing paper work, data evaluation and todays exam - 35 minutes  Vickie Yang M.D on 09/18/2015 at 9:08 AM    Note: This dictation was prepared with Dragon dictation along with smaller phrase technology. Any transcriptional errors that result from this process are unintentional.

## 2015-09-18 NOTE — Progress Notes (Signed)
Spoke with Vickie SeveranceShondelle, [employer] rep at [phone], to notify of non-emergent EMS transport.  Auth notification reference given as [id number].   Service date range good from 09/18/15 - 12/17/15.   Gap exception requested to determine if services can be considered at an in-network level.

## 2015-09-18 NOTE — Clinical Social Work Placement (Signed)
   CLINICAL SOCIAL WORK PLACEMENT  NOTE  Date:  09/18/2015  Patient Details  Name: Vickie Yang MRN: [id number] Date of Birth: 02-19-1925  Clinical Social Work is seeking post-discharge placement for this patient at the Skilled  Nursing Facility level of care (*CSW will initial, date and re-position this form in  chart as items are completed):  Yes   Patient/family provided with [place] Clinical Social Work Department's list of facilities offering this level of care within the geographic area requested by the patient (or if unable, by the patient's family).  Yes   Patient/family informed of their freedom to choose among providers that offer the needed level of care, that participate in Medicare, Medicaid or managed care program needed by the patient, have an available bed and are willing to accept the patient.  Yes   Patient/family informed of [facility]'s ownership interest in [facility] and [facility], as well as of the fact that they are under no obligation to receive care at these facilities.  PASRR submitted to EDS on       PASRR number received on       Existing PASRR number confirmed on 09/16/15     FL2 transmitted to all facilities in geographic area requested by pt/family on 09/16/15     FL2 transmitted to all facilities within larger geographic area on       Patient informed that his/her managed care company has contracts with or will negotiate with certain facilities, including the following:        Yes   Patient/family informed of bed offers received.  Patient chooses bed at  [facility] )     Physician recommends and patient chooses bed at      Patient to be transferred to  [facility] ) on 09/18/15.  Patient to be transferred to facility by  [facility] )     Patient family notified on 09/18/15 of transfer.  Name of family member notified:   (Patient's daughter Clydie BraunKaren is aware of D/C today. )     PHYSICIAN       Additional  Comment:    _______________________________________________ Haig ProphetMorgan, Rochelle Nephew G, LCSW 09/18/2015, 10:49 AM

## 2015-09-18 NOTE — Progress Notes (Signed)
Patient is medially stable for D/C to [facility] today. [facility] patient will go to room 501. RN will call report to 500 hall RN and arrange EMS for transport. Clinical Child psychotherapistocial Worker (CSW) sent D/C Summary, FL2 and D/C Packet to [employer] via [employer]. CSW attempted to call patient's daughter [name] however she did not answer and a voicemail was left. CSW also contacted patient's daughter Clydie BraunKaren and made her aware of above. Please reconsult if future social work needs arise. CSW signing off.   Jetta LoutBailey Morgan, LCSW [phone]

## 2015-09-18 NOTE — [place] FL2 (Signed)
[place] MEDICAID FL2 LEVEL OF CARE SCREENING TOOL     IDENTIFICATION  Patient Name: Vickie Yang Birthdate: Jan 20, 1925 Sex: female Admission Date (Current Location): 09/15/2015  [place]   Facility and Address:  [facility], [address], [place], [place] [address]      Provider Number: [id number]  Attending Physician Name and Address:  Katha HammingSnehalatha Konidena, MD  Relative Name and Phone Number:       Current Level of Care: Hospital Recommended Level of Care: Skilled Nursing Facility Prior Approval Number:    Date Approved/Denied:   PASRR Number:  ( [id number] )  Discharge Plan: SNF    Current Diagnoses: Patient Active Problem List   Diagnosis Date Noted  . Pressure ulcer 09/16/2015  . Fall 09/15/2015  . Absolute anemia 04/07/2015  . Low serum vitamin D 04/07/2015  . Combined fat and carbohydrate induced hyperlipemia 04/07/2015  . Closed intertrochanteric fracture of femur (HCC) 04/04/2015  . Hip fracture (HCC) 03/30/2015  . Edema leg 01/20/2015  . Chronic kidney disease (CKD), stage III (moderate) 01/20/2015  . Left leg pain 01/20/2015  . MI (mitral incompetence) 01/20/2015  . TI (tricuspid incompetence) 01/20/2015  . Benign essential HTN 09/13/2014  . Chronic systolic heart failure (HCC) 05/20/2014  . Breath shortness 10/03/2013  . Open ankle wound 02/28/2013  . Closed fracture of part of fibula with tibia 12/22/2012  . Arteriosclerosis of coronary artery 12/19/2012  . Clinical depression 12/19/2012  . GERD (gastroesophageal reflux disease) 12/19/2012  . Fracture of tibial plafond 12/13/2012  . Bimalleolar ankle fracture 12/06/2012  . Ankle pain 12/06/2012    Orientation RESPIRATION BLADDER Height & Weight     Self  Normal Continent Weight: 99 lb 12.8 oz (45.269 kg) Height:  5' (152.4 cm)  BEHAVIORAL SYMPTOMS/MOOD NEUROLOGICAL BOWEL NUTRITION STATUS   (none )  (none ) Continent Diet (Diet:  Heart Healthy )  AMBULATORY STATUS COMMUNICATION OF NEEDS Skin   Extensive Assist Verbally PU Stage and Appropriate Care (Pressure Ulcer Stage 3: Left Heel )                       Personal Care Assistance Level of Assistance  Bathing, Feeding, Dressing Bathing Assistance: Limited assistance Feeding assistance: Independent Dressing Assistance: Limited assistance     Functional Limitations Info  Sight, Hearing, Speech Sight Info: Adequate Hearing Info: Impaired Speech Info: Adequate    SPECIAL CARE FACTORS FREQUENCY  PT (By licensed PT), OT (By licensed OT)     PT Frequency:  (5) OT Frequency:  (5)            Contractures      Additional Factors Info  Code Status, Allergies Code Status Info:  (DNR ) Allergies Info:  (No Known Allergies )           Current Medications (09/18/2015):  This is the current hospital active medication list Current Facility-Administered Medications  Medication Dose Route Frequency Provider Last Rate Last Dose  . 0.9 %  sodium chloride infusion   Intravenous Continuous Enedina FinnerSona Patel, MD 75 mL/hr at 09/17/15 2111    . acetaminophen (TYLENOL) tablet 650 mg  650 mg Oral Q6H PRN Oralia Manisavid Willis, MD       Or  . acetaminophen (TYLENOL) suppository 650 mg  650 mg Rectal Q6H PRN Oralia Manisavid Willis, MD      . amLODipine (NORVASC) tablet 5 mg  5 mg Oral Daily Oralia Manisavid Willis, MD   5  mg at 09/17/15 0911  . aspirin EC tablet 81 mg  81 mg Oral Daily Oralia Manis, MD   81 mg at 09/17/15 0911  . atorvastatin (LIPITOR) tablet 20 mg  20 mg Oral Daily Oralia Manis, MD   20 mg at 09/17/15 0911  . clopidogrel (PLAVIX) tablet 75 mg  75 mg Oral Daily Oralia Manis, MD   75 mg at 09/17/15 0912  . docusate sodium (COLACE) capsule 100 mg  100 mg Oral BID Katha Hamming, MD   100 mg at 09/17/15 2111  . donepezil (ARICEPT) tablet 5 mg  5 mg Oral QHS Oralia Manis, MD   5 mg at 09/17/15 2111  . heparin injection 5,000 Units  5,000 Units Subcutaneous Q8H Oralia Manis, MD    5,000 Units at 09/18/15 0505  . morphine 2 MG/ML injection 2 mg  2 mg Intravenous Q4H PRN Oralia Manis, MD      . ondansetron [facility]) tablet 4 mg  4 mg Oral Q6H PRN Oralia Manis, MD       Or  . ondansetron [facility]) injection 4 mg  4 mg Intravenous Q6H PRN Oralia Manis, MD      . oxyCODONE-acetaminophen (PERCOCET/ROXICET) 5-325 MG per tablet 1 tablet  1 tablet Oral Q4H PRN Katha Hamming, MD   1 tablet at 09/18/15 0505  . pantoprazole (PROTONIX) EC tablet 40 mg  40 mg Oral Daily Oralia Manis, MD   40 mg at 09/17/15 0911  . sertraline (ZOLOFT) tablet 25 mg  25 mg Oral Daily Oralia Manis, MD   25 mg at 09/17/15 0910  . sodium chloride flush (NS) 0.9 % injection 3 mL  3 mL Intravenous Q12H Oralia Manis, MD   3 mL at 09/17/15 2112     Discharge Medications: Please see discharge summary for a list of discharge medications.  Relevant Imaging Results:  Relevant Lab Results:   Additional Information  (SSN: [id number])  Haig Prophet, LCSW

## 2015-10-27 ENCOUNTER — Encounter: Payer: Self-pay | Admitting: *Deleted

## 2015-12-02 DEATH — deceased

## 2017-01-05 IMAGING — CR DG CHEST 1V PORT
1 series · 1 of 1 positions shown · non-contrast
Comparison: Radiograph [DATE]

CLINICAL DATA: Weakness hypertension.

EXAM:
PORTABLE CHEST 1 VIEW

[chest ap]
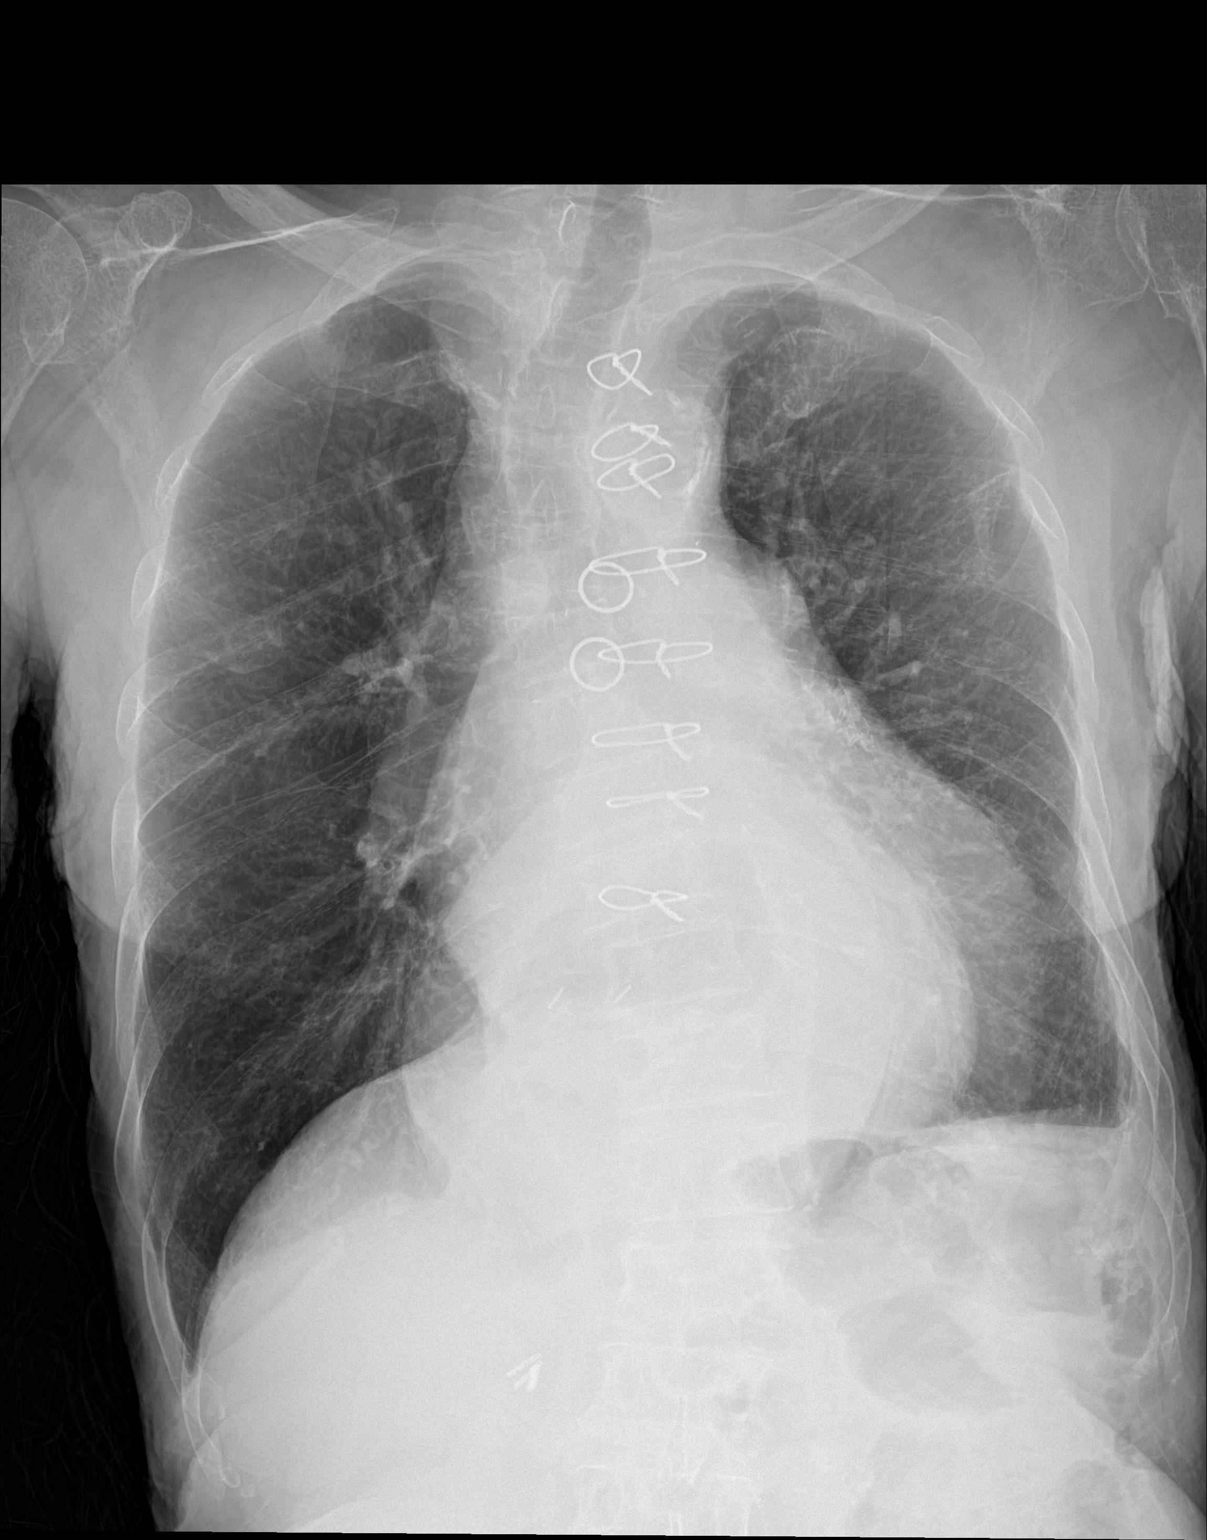

[1 of 1 positions shown; findings below may reference images not displayed]

FINDINGS: Sternotomy wires overlie enlarged cardiac silhouette. Large rounded
retrocardiac density likely represents hiatal hernia. Lungs are
hyperinflated. There is chronic bronchitic markings. Mild basilar
scarring on the LEFT.
IMPRESSION: 1. No acute cardiopulmonary findings.
2. Cardiomegaly and hyperinflated lungs.
3. Large hiatal hernia.

## 2017-04-08 IMAGING — CR DG HIP (WITH OR WITHOUT PELVIS) 2-3V*L*
1 series · 4 of 4 positions shown · non-contrast
Comparison: 03/30/2015

CLINICAL DATA: Left hip and upper leg pain for 1 month.

EXAM:
DG HIP (WITH OR WITHOUT PELVIS) 2-3V LEFT

[Series 2: t femur left 0-3yrs · 0.14mm/px · 4 of 4 slices shown]
[im 1/4]
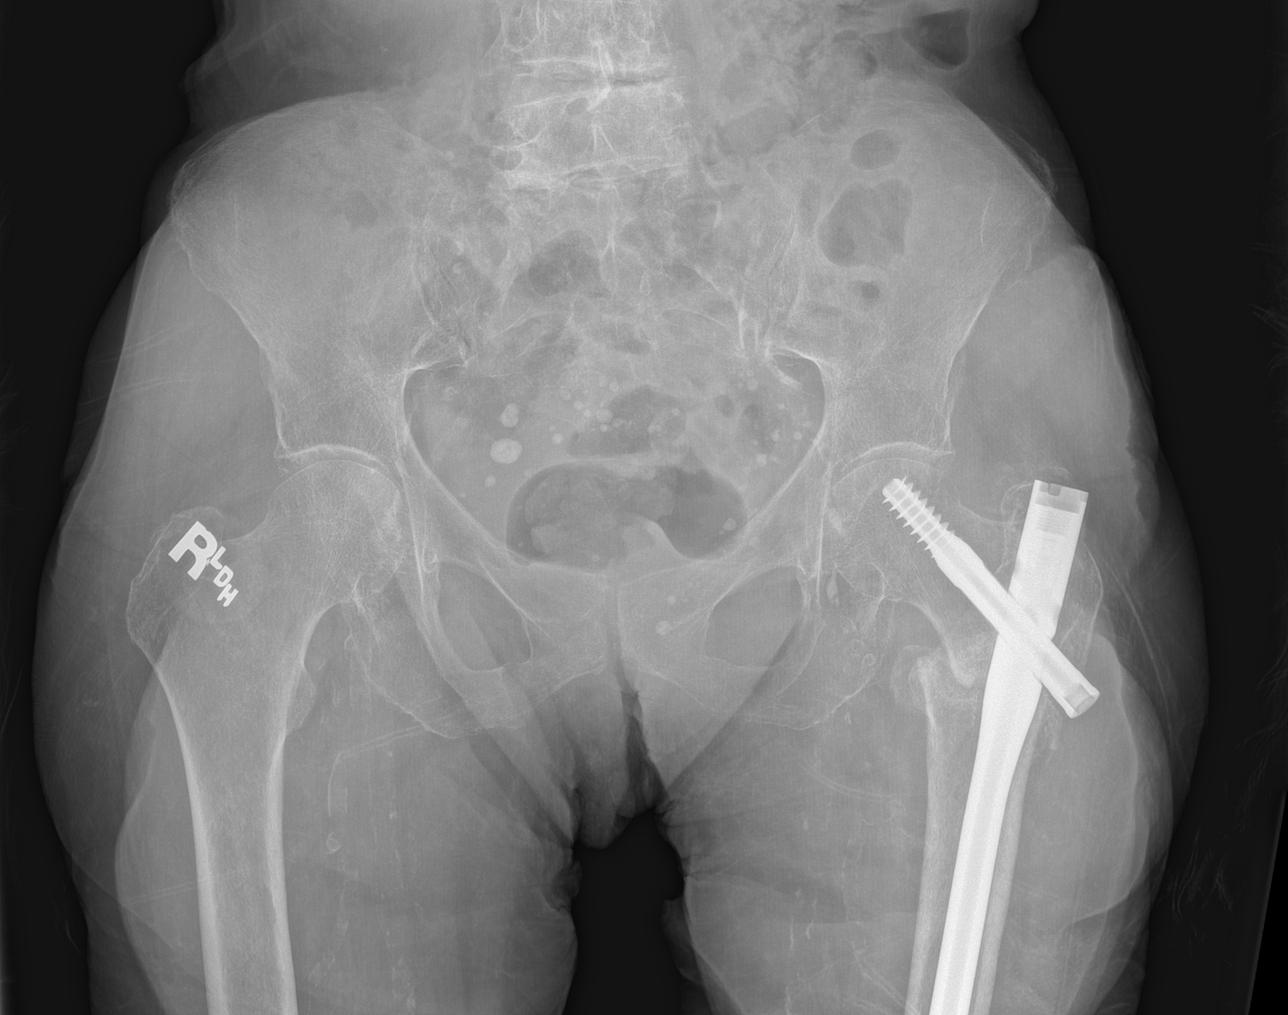
[im 2/4]
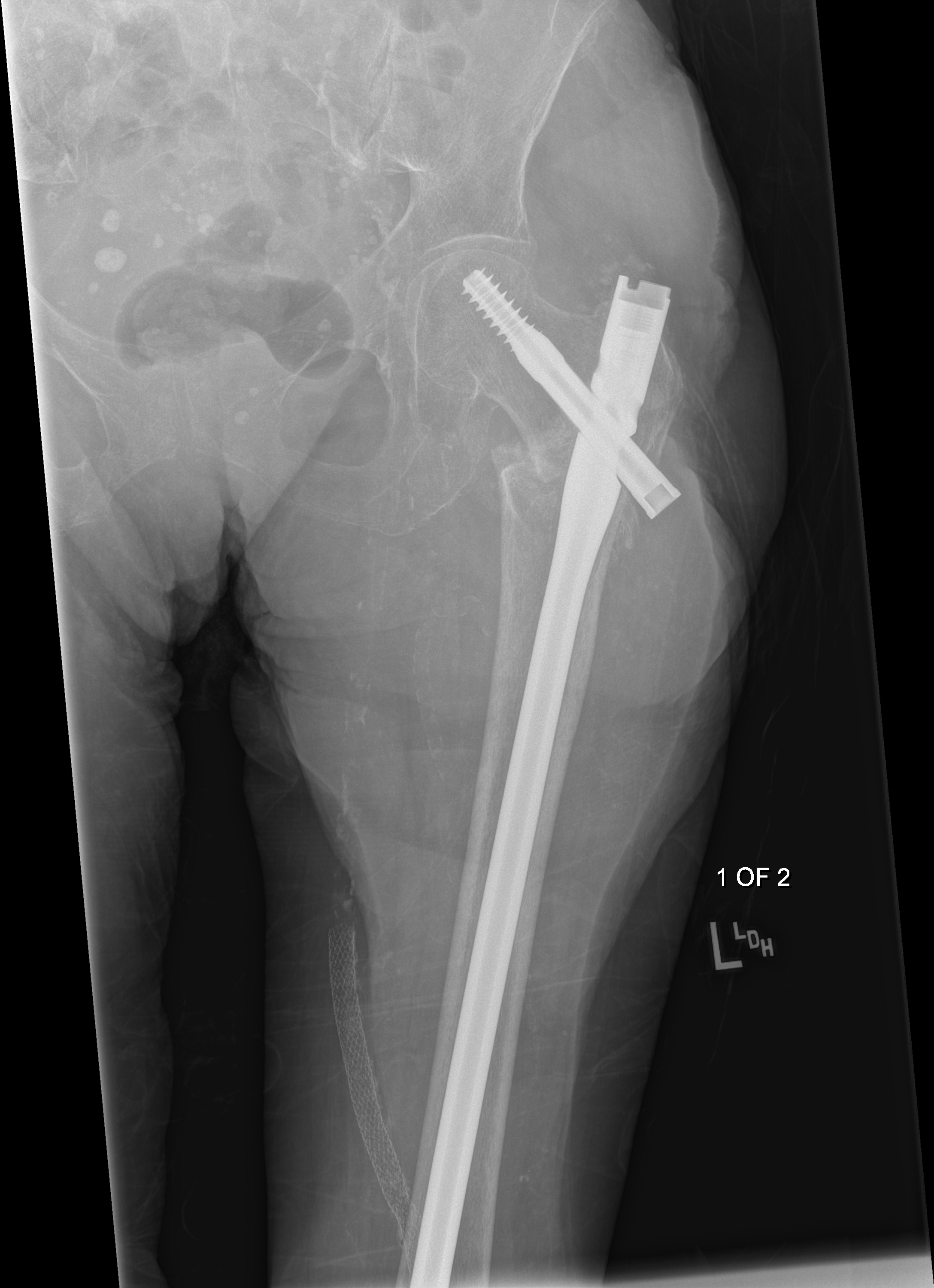
[im 3/4]
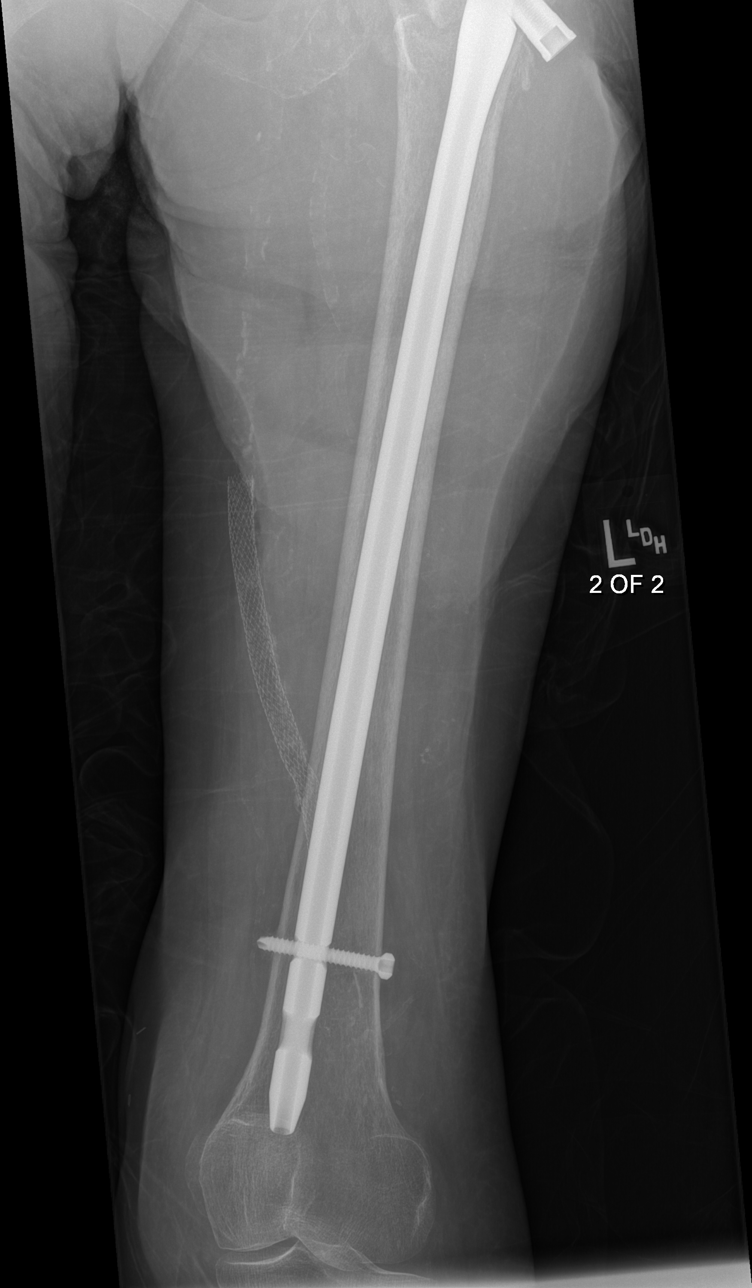
[im 4/4]
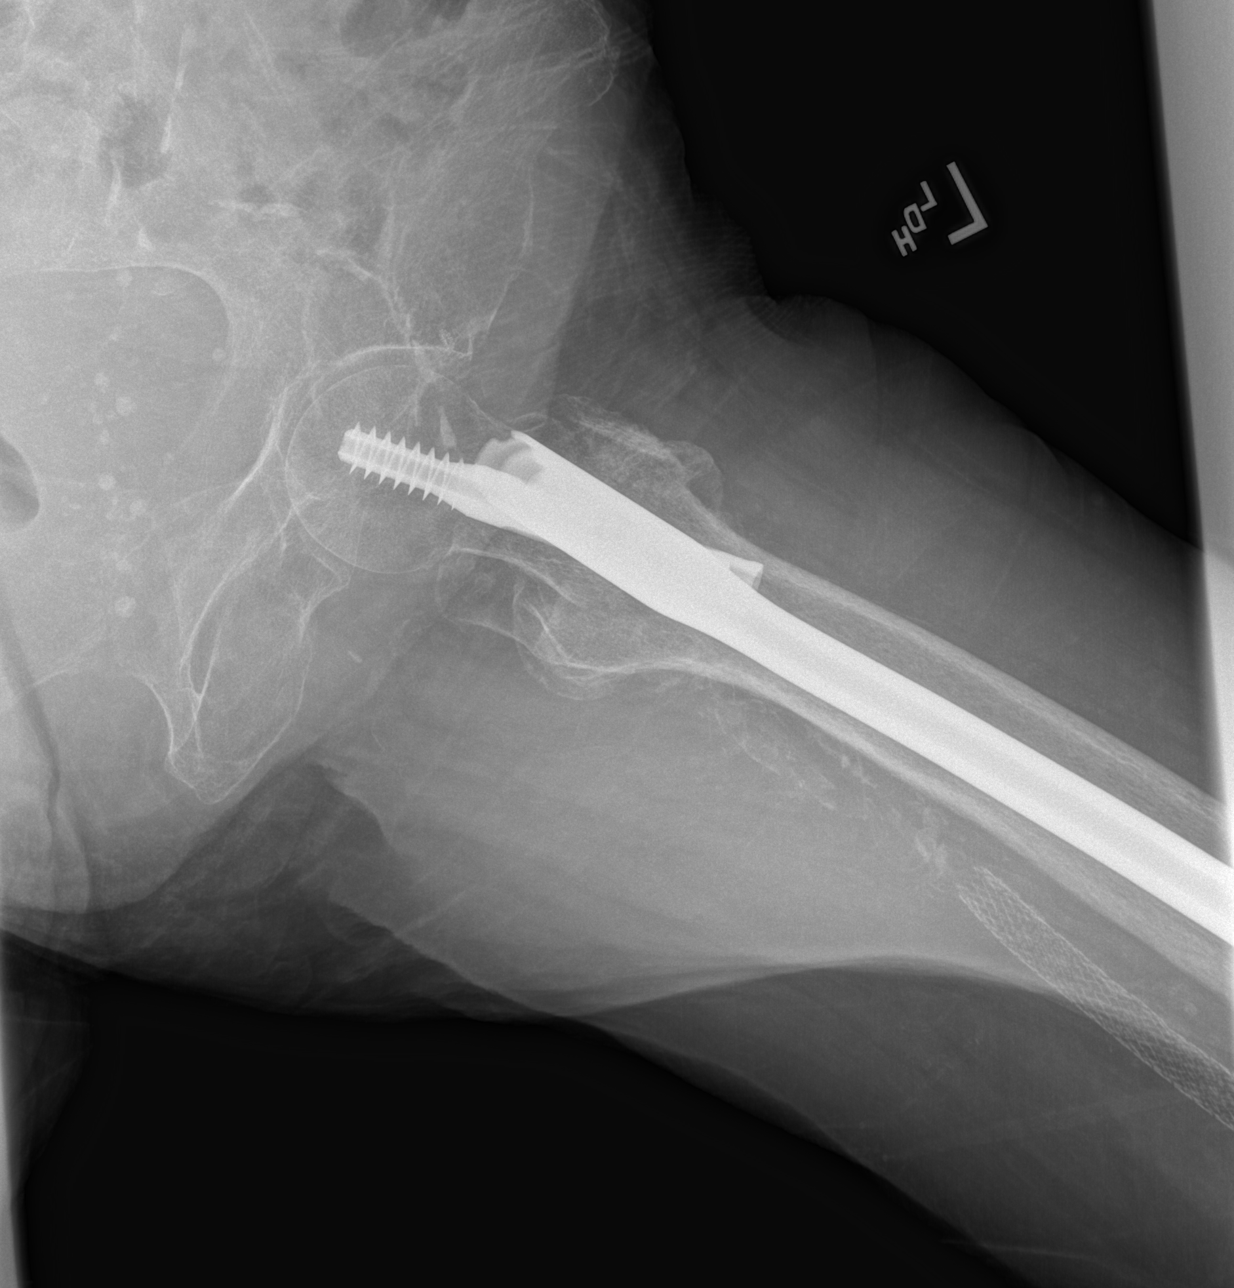

[4 of 4 positions shown; findings below may reference images not displayed]

FINDINGS: Ununited left intertrochanteric fracture transfixed with a intra
medullary nail with an interlocking femoral neck screw. There is no
other fracture or dislocation. There is generalized osteopenia.
IMPRESSION: Ununited left intertrochanteric fracture transfixed with an
intramedullary nail with an interlocking femoral neck screw.

## 2017-06-23 IMAGING — CR DG PELVIS 1-2V
1 series · 1 of 1 positions shown · non-contrast
Comparison: Multiple exams, including 12/01/2012 an report from
05/30/2015

CLINICAL DATA: Fall getting out of bed today. Left femur pain.
History of left hip fracture.

EXAM:
PELVIS - 1-2 VIEW

[pelvis ap]
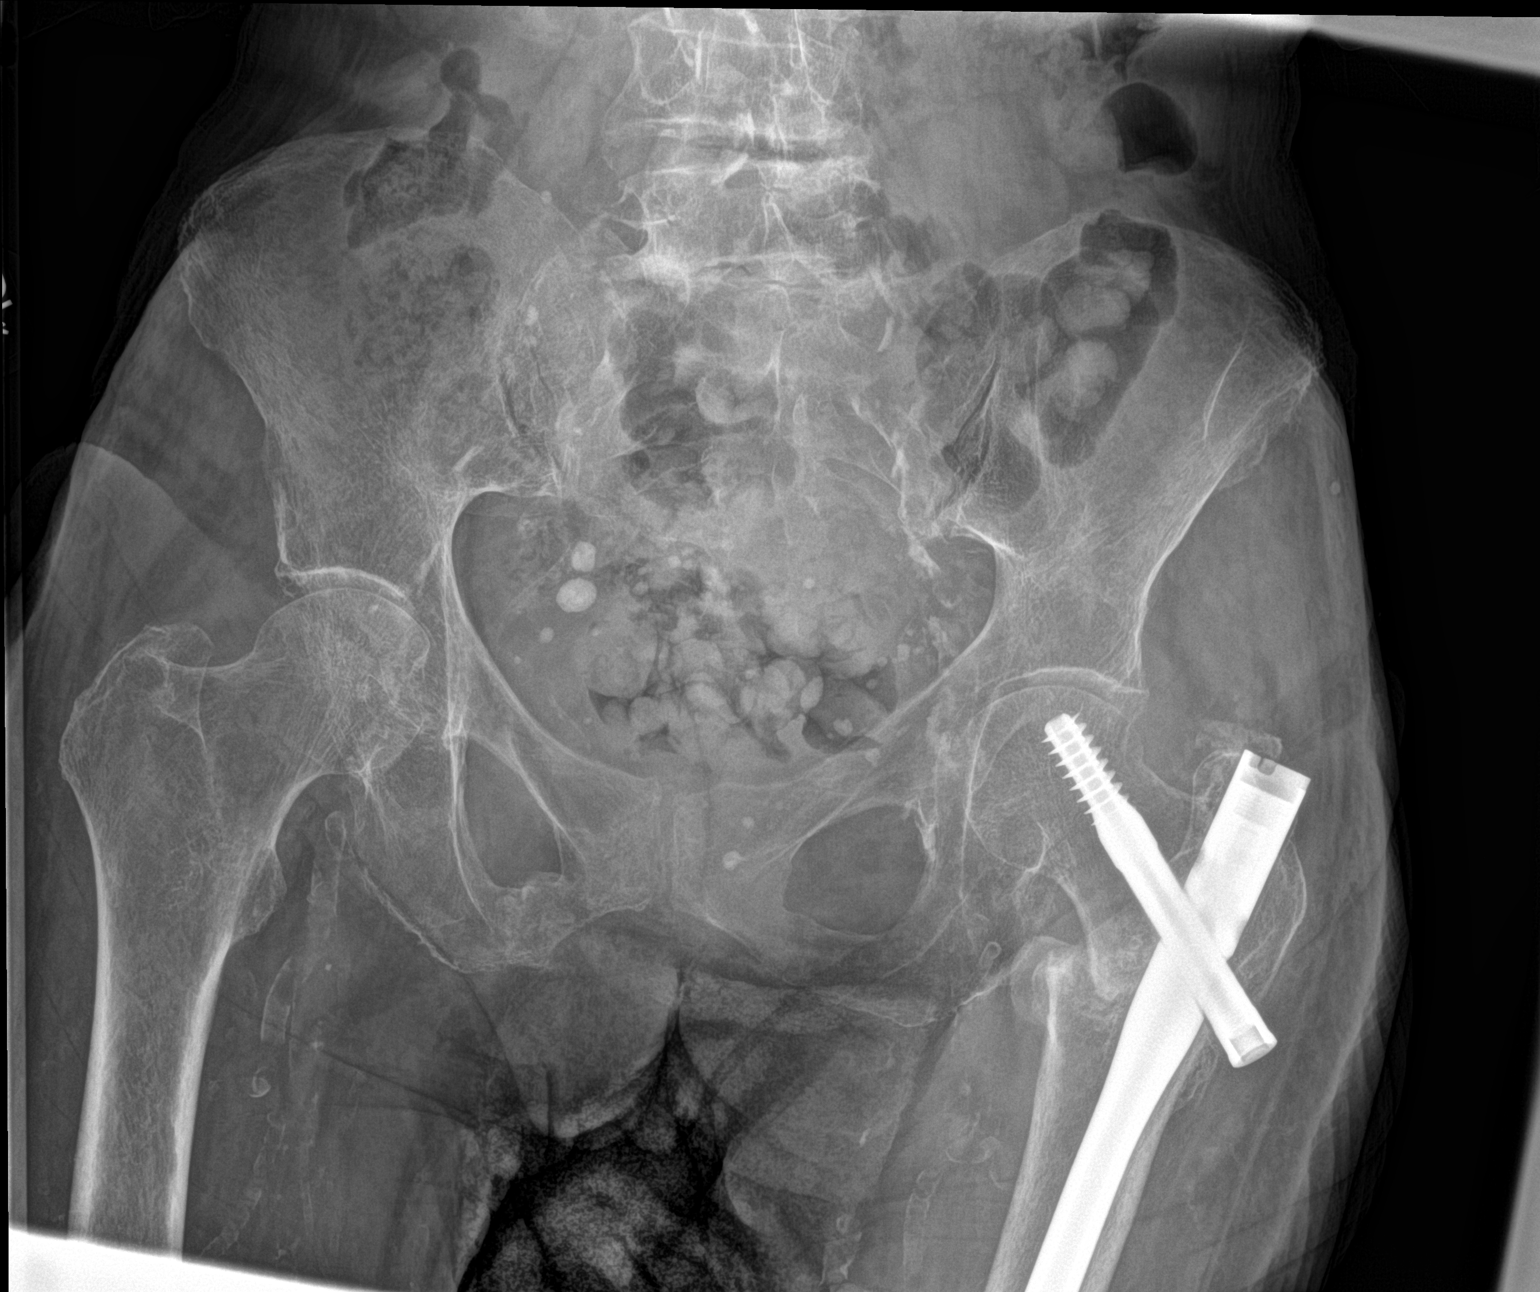

[1 of 1 positions shown; findings below may reference images not displayed]

FINDINGS: Severe bony demineralization. Lower lumbar spondylosis and
degenerative disc disease. Vascular calcifications noted.

Left hip intramedullary nail, traversing an nonunited left
intertrochanteric hip fracture.

Deformity of the right-sided pubic rami likely from healed
fractures.
IMPRESSION: 1. Nonunited left hip fracture, traversed by a long stem
intramedullary nail and hip screw.
2. There is some deformity in the left pubic rami, I favor this
being from old fracture.
3. Severe bony demineralization.
4. Vascular calcifications.
